# Patient Record
Sex: Female | Born: 1985 | Race: White | Hispanic: No | Marital: Married | Smoking: Former smoker
Health system: Southern US, Community
[De-identification: ages and names within clinical notes are randomized; demographics above are authoritative.]

## PROBLEM LIST (undated history)

## (undated) DIAGNOSIS — F909 Attention-deficit hyperactivity disorder, unspecified type: Secondary | ICD-10-CM

## (undated) HISTORY — PX: ABDOMINAL SURGERY: SHX537

## (undated) HISTORY — DX: Attention-deficit hyperactivity disorder, unspecified type: F90.9

---
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
## 2012-07-27 ENCOUNTER — Inpatient Hospital Stay: Payer: Self-pay | Admitting: Obstetrics and Gynecology

## 2013-04-16 IMAGING — US US OB US >=[ID] SNGL FETUS
1 series · 14 of 28 positions shown · non-contrast
Comparison: none

REASON FOR EXAM: PPROM
COMMENTS:   May transport without cardiac monitor

PROCEDURE:     US  - US OB GREATER/OR EQUAL TO 0ER3N  - July 27, 2012 [DATE]
RESULT:

[Series 1: us ob us >=(id) sngl fetus · 0.26mm/px · 14 of 59 slices shown]
[im 3/59]
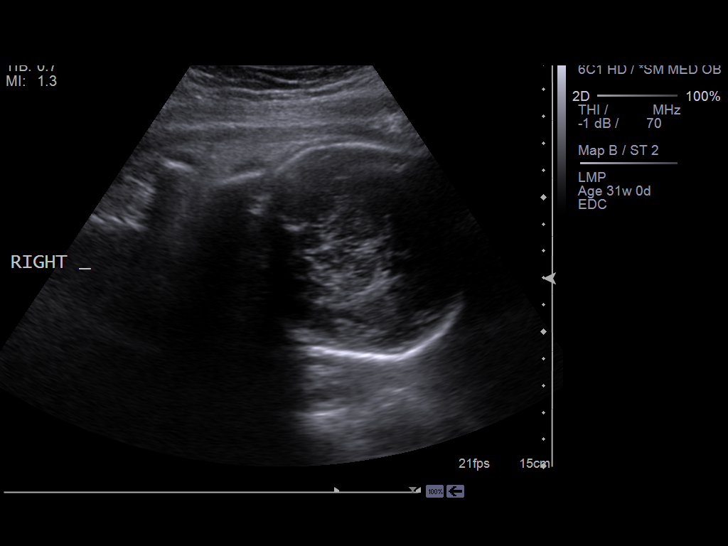
[im 7/59]
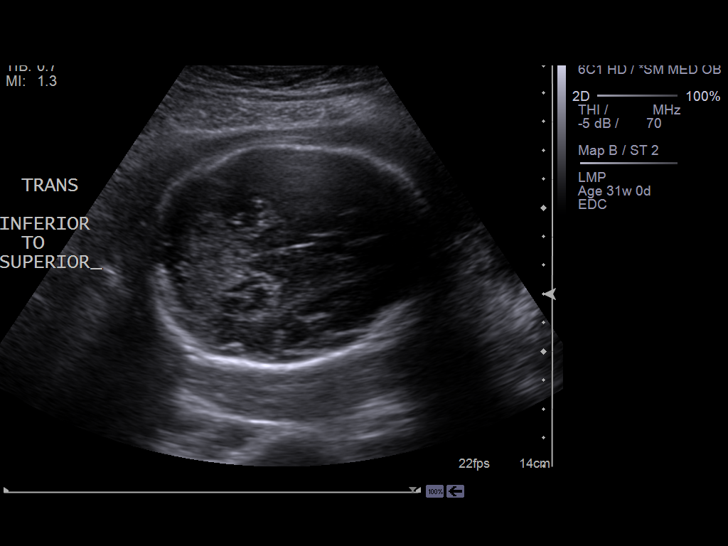
[im 11/59]
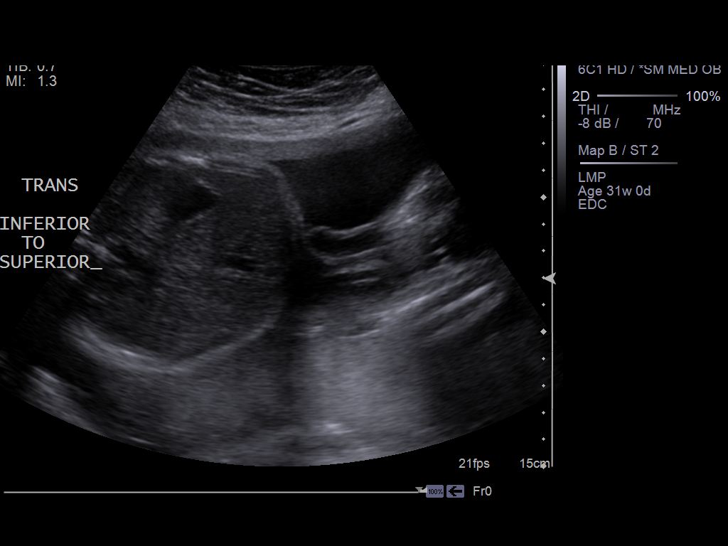
[im 16/59]
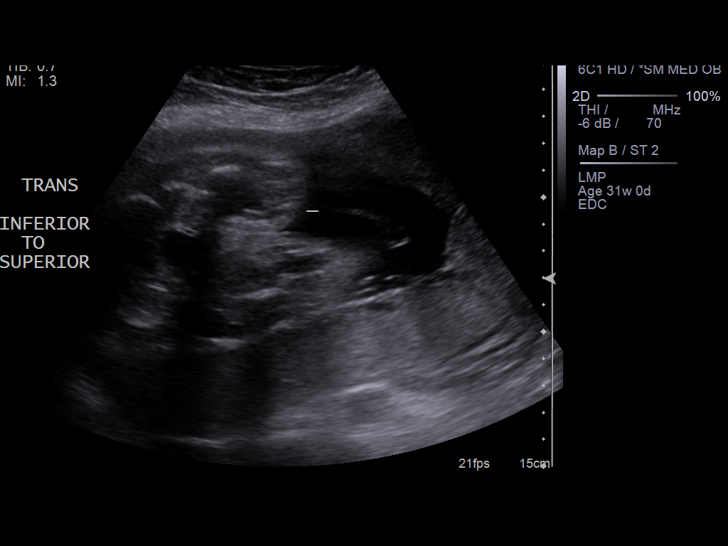
[im 20/59]
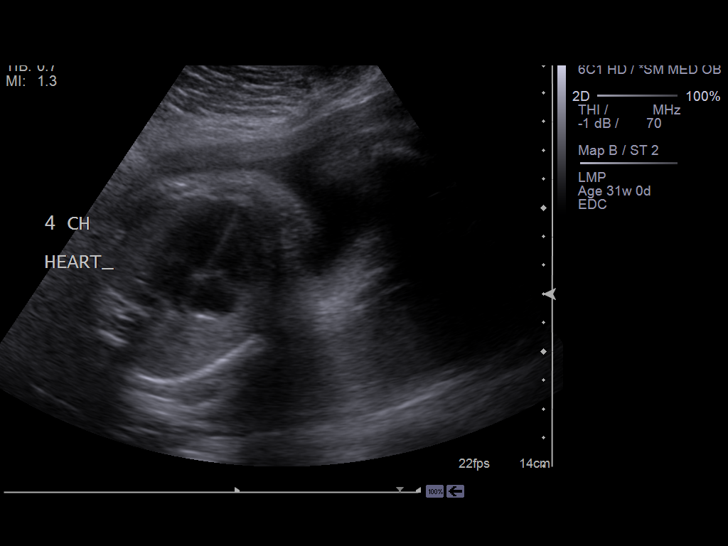
[im 24/59]
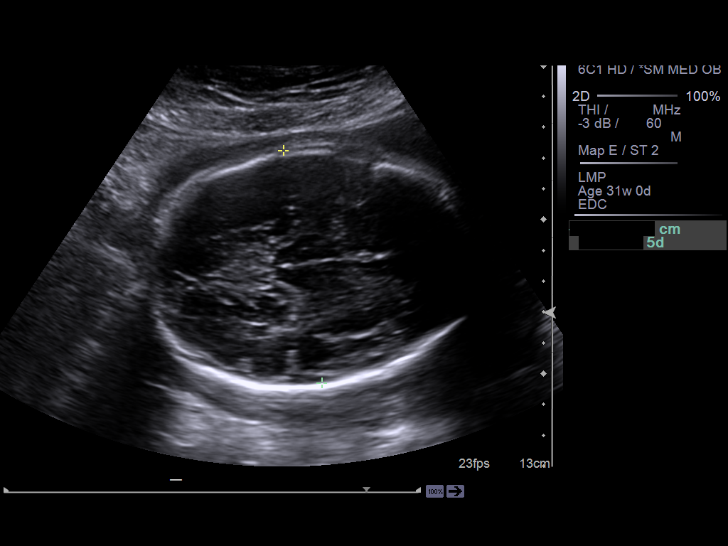
[im 28/59]
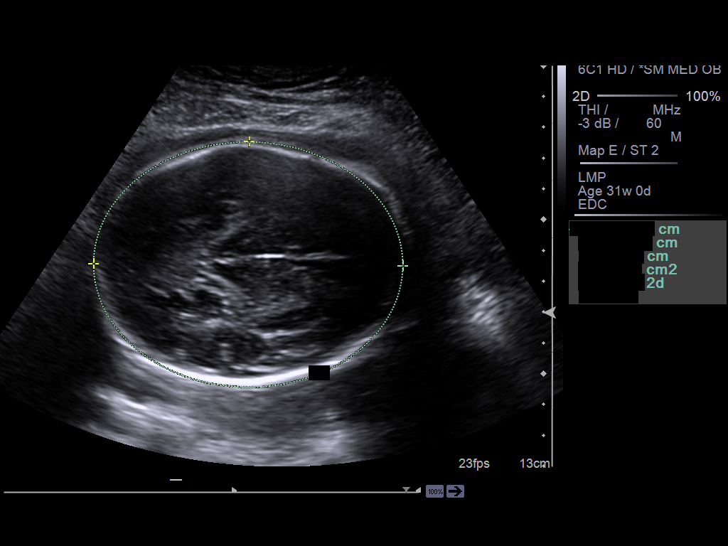
[im 33/59]
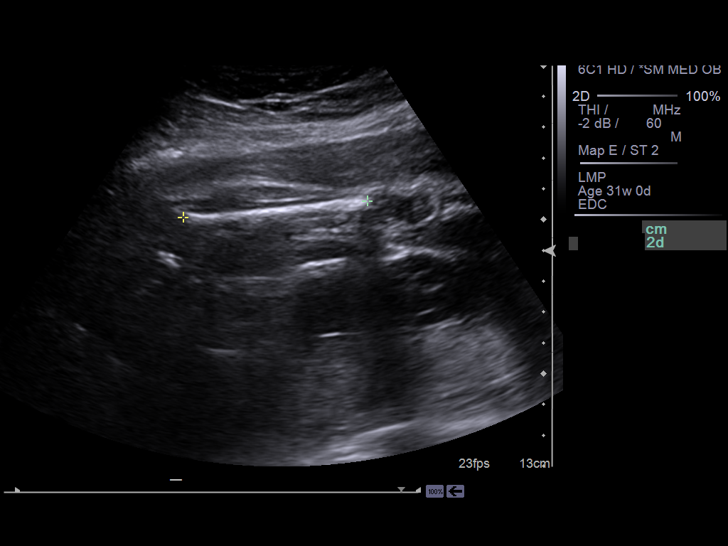
[im 37/59]
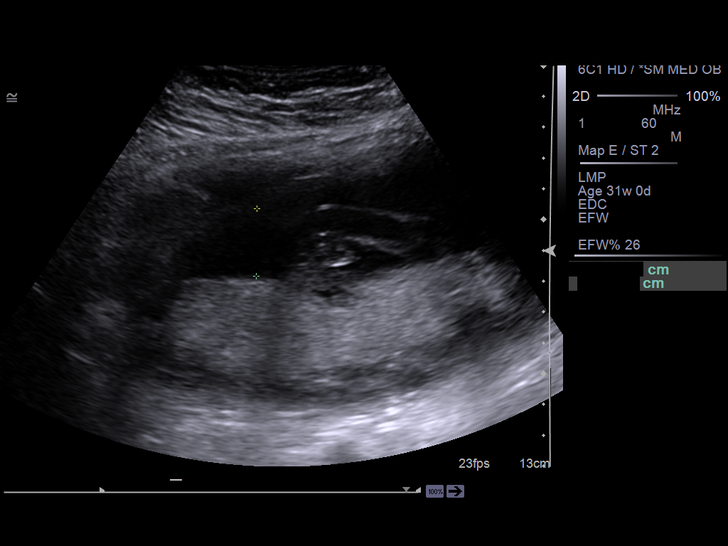
[im 41/59]
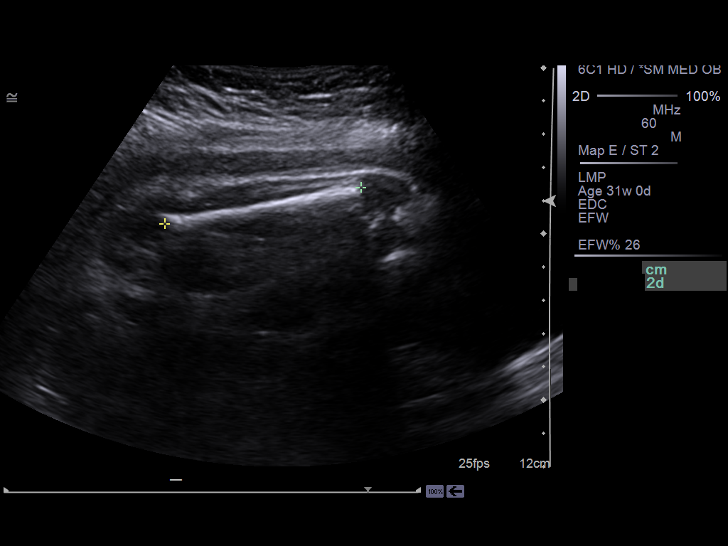
[im 46/59]
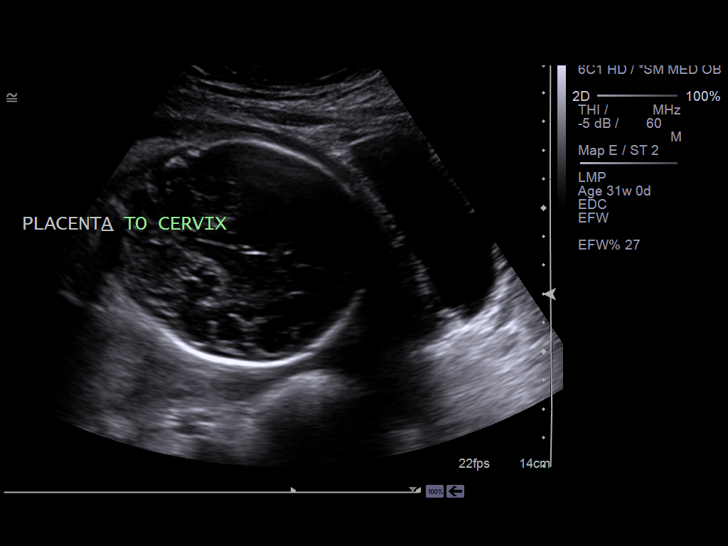
[im 50/59]
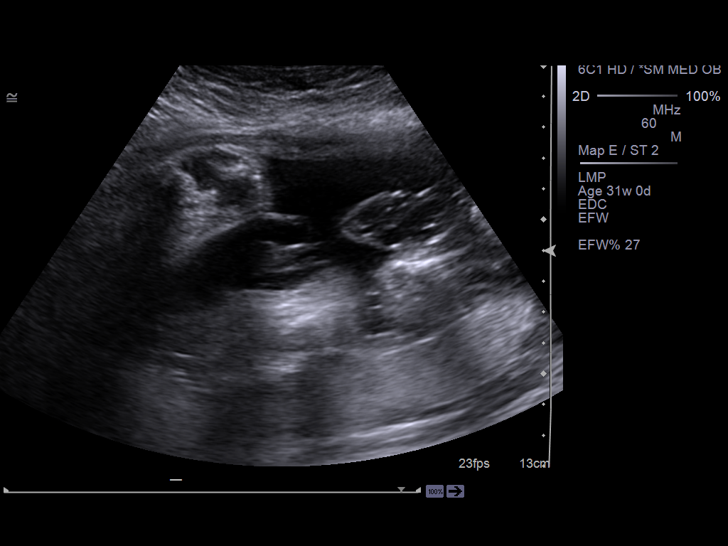
[im 54/59]
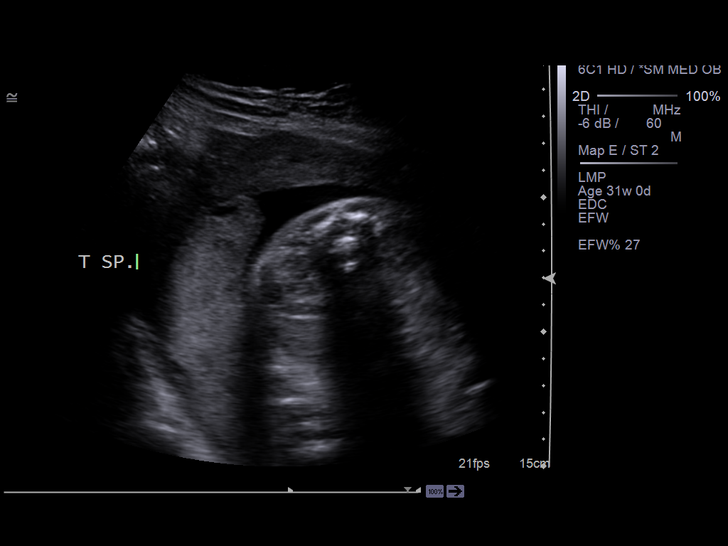
[im 59/59]
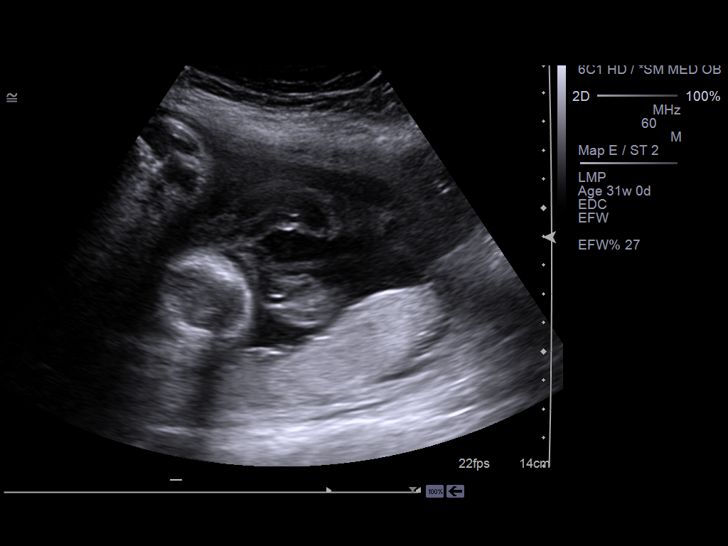

[14 of 28 positions shown; findings below may reference images not displayed]

FINDINGS: Single, viable intrauterine pregnancy is identified in vertex
presentation. Estimated fetal heart rate is 142 beats per minute. The
placenta is posterior and demonstrates Grade II morphology. There is no
evidence of placenta previa or subchorionic hemorrhage. The amniotic fluid
index is 10.4 cm measured in a four quadrant fashion. Quadrant 1 is 2.1cm;
quadrant 2 is 2.68; quadrant 3 is 2.47 and quadrant 4 is 3.09 cm. The
amniotic fluid index is within normal limits. Fetal biometry calculates
estimated gestational age of 30 weeks, 6 days with an EDD of 09/28/2012
congruent with the patient's LMP. Estimated fetal weight is 1,613 grams + / -
239 grams which correlates to 3 pounds, 9 ounces + / - 8 ounces.
IMPRESSION: 1.  Single, viable intrauterine pregnancy as described above. Estimated
fetal weight is 1,613 grams as described above and the amniotic fluid index
is 10.4 cm.
2.  A preliminary faxed report was relayed to the floor on 07/27/2012 at

## 2013-10-10 ENCOUNTER — Ambulatory Visit: Payer: Self-pay | Admitting: Podiatry

## 2014-03-29 ENCOUNTER — Ambulatory Visit (INDEPENDENT_AMBULATORY_CARE_PROVIDER_SITE_OTHER): Payer: BC Managed Care – PPO | Admitting: Family Medicine

## 2014-03-29 ENCOUNTER — Encounter: Payer: Self-pay | Admitting: Family Medicine

## 2014-03-29 VITALS — BP 111/76 | HR 78 | Temp 98.7°F | Ht 64.0 in | Wt 175.0 lb

## 2014-03-29 DIAGNOSIS — F988 Other specified behavioral and emotional disorders with onset usually occurring in childhood and adolescence: Secondary | ICD-10-CM

## 2014-03-29 MED ORDER — AMPHETAMINE-DEXTROAMPHETAMINE 20 MG PO TABS: 20.0000 mg | ORAL_TABLET | Freq: Every day | ORAL | Status: DC

## 2014-03-29 NOTE — Progress Notes (Signed)
   Subjective:    Patient ID: Katelyn Waller, female    DOB: 01/18/1986, 28 y.o.   MRN: [id number]  HPI This 28 y.o. female presents for evaluation of ADD.  She is having difficulty with focus and wants to get back on her ADD medicine.   Review of Systems No chest pain, SOB, HA, dizziness, vision change, N/V, diarrhea, constipation, dysuria, urinary urgency or frequency, myalgias, arthralgias or rash.     Objective:   Physical Exam  Vital signs noted  Well developed well nourished female.  HEENT - Head atraumatic Normocephalic                Eyes - PERRLA, Conjuctiva - clear Sclera- Clear EOMI                Ears - EAC's Wnl TM's Wnl Gross Hearing WNL                 Throat - oropharanx wnl Respiratory - Lungs CTA bilateral Cardiac - RRR S1 and S2 without murmur GI - Abdomen soft Nontender and bowel sounds active x 4       Assessment & Plan:  ADD (attention deficit disorder) - Plan: amphetamine-dextroamphetamine (ADDERALL) 20 MG tablet  Follow up in 3 months  Deatra CanterWilliam J Nilson Tabora FNP

## 2014-05-01 ENCOUNTER — Other Ambulatory Visit: Payer: Self-pay | Admitting: Family Medicine

## 2014-05-01 ENCOUNTER — Telehealth: Payer: Self-pay | Admitting: *Deleted

## 2014-05-01 DIAGNOSIS — F988 Other specified behavioral and emotional disorders with onset usually occurring in childhood and adolescence: Secondary | ICD-10-CM

## 2014-05-01 MED ORDER — AMPHETAMINE-DEXTROAMPHETAMINE 20 MG PO TABS: 20.0000 mg | ORAL_TABLET | Freq: Every day | ORAL | Status: DC

## 2014-05-01 NOTE — Telephone Encounter (Signed)
Requesting refill on Adderall

## 2014-06-13 ENCOUNTER — Other Ambulatory Visit: Payer: Self-pay | Admitting: Family Medicine

## 2014-06-13 MED ORDER — AMPHETAMINE-DEXTROAMPHETAMINE 20 MG PO TABS: 20.0000 mg | ORAL_TABLET | Freq: Every day | ORAL | Status: DC

## 2014-07-12 ENCOUNTER — Telehealth: Payer: Self-pay | Admitting: *Deleted

## 2014-07-12 ENCOUNTER — Other Ambulatory Visit: Payer: Self-pay | Admitting: Family Medicine

## 2014-07-12 MED ORDER — AMPHETAMINE-DEXTROAMPHETAMINE 20 MG PO TABS: 20.0000 mg | ORAL_TABLET | Freq: Every day | ORAL | Status: DC

## 2014-07-12 NOTE — Telephone Encounter (Signed)
Pt needs refill on Adderall Please advise

## 2014-09-05 ENCOUNTER — Other Ambulatory Visit: Payer: Self-pay | Admitting: Family Medicine

## 2014-09-05 DIAGNOSIS — F988 Other specified behavioral and emotional disorders with onset usually occurring in childhood and adolescence: Secondary | ICD-10-CM

## 2014-09-05 MED ORDER — AMPHETAMINE-DEXTROAMPHETAMINE 20 MG PO TABS: 20.0000 mg | ORAL_TABLET | Freq: Every day | ORAL | Status: DC

## 2014-12-17 NOTE — Consult Note (Signed)
    Maternal Age 31    Gravida 2    Para 1    Term Deliveries 1    Preterm Deliveries 0    Abortions 0    Living Children 1    Final EDD (dd-mmm-yy) 27-Sep-2012    Gestational Age (wk, days based on mom's dates) 1731    Gestation Single    Blood Type (Maternal) O positive    Antibody Screen Results (Maternal) negative    HIV Results (Maternal) negative    Gonorrhea Results (Maternal) negative    Chlamydia Results (Maternal) negative    Hepatitis C Culture (Maternal) unknown    Herpes Results (Maternal) n/a    VDRL/RPR/Syphilis Results (Maternal) negative    Varicella Titer Results (Maternal) Positive    Rubella Results (Maternal) immune    Hepatitis B Surface Antigen Results (Maternal) negative    Group B Strep Results Maternal (Result >5wks must be treated as unknown) unknown/result > 5 weeks ago     Additional Comments Called to consult on this [redacted]wk gestation age infant born to a G2P1 mother with 2cm dilation of cervix and ROM this evening. I discussed with mother, father and grandmother the typical complications of birth at 830weeks gestation age. We discussed morbidity and mortality, respiratory support, feeding, IV access peripheral and central, length of time in NICU, thermal stability, and basic premature infant care. We discussed typical follow up NICU care regarding ROP and HUS. We also discussed the infant???s inability to PO bottle feed until 34 weeks, and again the average length of time within an SCN setting.  Currently, our SCN is full and I discussed with family that if mother does start to move towards active labor that we could have her moved to a tertiary care center so that she could be with her infant. If her infant delivery precipitously here, we will have no problems treating and stabilizing the infant, but I would have to transport the infant to [facility] N[facility] due to no available bed in our SCN.  Mother and father understand the above. During the evening  mother did start to progress into active labor. Discussed with OBGYN, and recommended [facility] so that she and the infant can be together.  Total consult time 45 minutes.    Parental Contact Parents informed at length regarding prenatal care and plan   Electronic Signatures: Corliss ParishGaliote, Foye Haggart P (MD)  (Signed [phone]-Nov-13 05:24)  Authored: PREGNANCY and LABOR, ADDITIONAL COMMENTS   Last Updated: 28-Nov-13 05:24 by Corliss ParishGaliote, Kelia Gibbon P (MD)

## 2014-12-19 ENCOUNTER — Other Ambulatory Visit: Payer: Self-pay | Admitting: *Deleted

## 2014-12-19 MED ORDER — SERTRALINE HCL 50 MG PO TABS: 50.0000 mg | ORAL_TABLET | Freq: Every day | ORAL | Status: DC

## 2014-12-19 NOTE — Telephone Encounter (Signed)
Patient requesting refill on Zoloft 50 mg. She discontinued this during pregnancy and didn't restart it.  She will schedule an appointment to be seen within the next month but would like to go ahead and restart this for her anxiety/depression symptoms.

## 2015-01-07 NOTE — H&P (Signed)
L&D Evaluation:  History Expanded:   HPI 29 yo who was in the shower last night and got out and felt a gush of fluid when she got out, then when she dried off she felt another trickle going down her leg, she then got a towel and it did not help so she came in. she is 31 weeks today.    Gravida 2    Term 0    PreTerm 0    Abortion 1    Living 0    Blood Type (Maternal) O positive    Group B Strep Results Maternal (Result >5wks must be treated as unknown) unknown/result > 5 weeks ago    Maternal HIV Negative    Maternal Syphilis Ab Nonreactive    Maternal Varicella Immune    Rubella Results (Maternal) immune    Maternal [facility]-Dap Immune    [facility]    Presents with rom    Patient's Medical History No Chronic Illness    Patient's Surgical History none    Medications Pre Natal Vitamins    Allergies NKDA    Social History none    Family History Non-Contributory   ROS:   ROS All systems were reviewed.  HEENT, CNS, GI, GU, Respiratory, CV, Renal and Musculoskeletal systems were found to be normal.   Exam:   Vital Signs stable    Urine Protein not completed    General no apparent distress    Mental Status clear    Chest clear    Heart normal sinus rhythm    Abdomen gravid, non-tender    Fetal Position v    Back no CVAT    Edema no edema    Reflexes 1+    Pelvic no external lesions    Mebranes Ruptured    Description clear, fern pos, nitrazine pos, pool pos,    FHT normal rate with no decels, strictly reactive    Fetal Heart Rate 145    Ucx absent, pt feels crampoy,    Skin dry    Lymph no lymphadenopathy   Impression:   Impression PPROM   Plan:   Comments start PPROM protocol and call [facility]   Electronic Signatures: Adria DevonKlett, Kaikoa Magro (MD)  (Signed [phone]-Nov-13 02:19)  Authored: L&D Evaluation   Last Updated: 28-Nov-13 02:19 by Adria DevonKlett, Lorelie Biermann (MD)

## 2015-02-12 ENCOUNTER — Encounter: Payer: Self-pay | Admitting: Family

## 2015-02-12 ENCOUNTER — Ambulatory Visit (INDEPENDENT_AMBULATORY_CARE_PROVIDER_SITE_OTHER): Payer: No Typology Code available for payment source | Admitting: Family

## 2015-02-12 VITALS — BP 113/78 | HR 78 | Temp 97.0°F | Ht 64.0 in | Wt 182.0 lb

## 2015-02-12 DIAGNOSIS — F909 Attention-deficit hyperactivity disorder, unspecified type: Secondary | ICD-10-CM

## 2015-02-12 MED ORDER — LISDEXAMFETAMINE DIMESYLATE 30 MG PO CAPS: 30.0000 mg | ORAL_CAPSULE | Freq: Every day | ORAL | Status: DC

## 2015-02-12 NOTE — Progress Notes (Signed)
   Subjective:    Patient ID: Katelyn Waller, female    DOB: 03-10-1986, 29 y.o.   MRN: [id number]    HPI Pt presents to the office to establish care. Pt has a history of ADHD. Pt has taken vyvanse in the past which she states worked well. Pt was last on Adderall  And states she hated it made her feel "werid" and does not want to be back on it. Pt states  She also has a history of GAD and depression and was Zoloft for it. Pt states the medication made her feel so "sleepy" and she just stopped taking it. Pt currently not taking any medication at this time. Pt denies any headache, palpitations, SOB, or edema at this time.    Review of Systems  Constitutional: Negative.   HENT: Negative.   Eyes: Negative.   Respiratory: Negative.  Negative for shortness of breath.   Cardiovascular: Negative.  Negative for palpitations.  Gastrointestinal: Negative.   Endocrine: Negative.   Genitourinary: Negative.   Musculoskeletal: Negative.   Neurological: Negative.  Negative for headaches.  Hematological: Negative.   Psychiatric/Behavioral: Negative.   All other systems reviewed and are negative.      Objective:   Physical Exam  Constitutional: She is oriented to person, place, and time. She appears well-developed and well-nourished. No distress.  HENT:  Head: Normocephalic and atraumatic.  Right Ear: External ear normal.  Left Ear: External ear normal.  Nose: Nose normal.  Mouth/Throat: Oropharynx is clear and moist.  Eyes: Pupils are equal, round, and reactive to light.  Neck: Normal range of motion. Neck supple. No thyromegaly present.  Cardiovascular: Normal rate, regular rhythm, normal heart sounds and intact distal pulses.   No murmur heard. Pulmonary/Chest: Effort normal and breath sounds normal. No respiratory distress. She has no wheezes.  Abdominal: Soft. Bowel sounds are normal. She exhibits no distension. There is no tenderness.  Musculoskeletal: Normal range of motion. She  exhibits no edema or tenderness.  Neurological: She is alert and oriented to person, place, and time. She has normal reflexes. No cranial nerve deficit.  Skin: Skin is warm and dry.  Psychiatric: She has a normal mood and affect. Her behavior is normal. Judgment and thought content normal.  Vitals reviewed.   BP 113/78 mmHg  Pulse 78  Temp(Src) 97 F (36.1 C) (Oral)  Ht 5\' 4"  (1.626 m)  Wt 182 lb (82.555 kg)  BMI 31.22 kg/m2       Assessment & Plan:  1. Attention deficit hyperactivity disorder (ADHD), unspecified ADHD type -Meds as prescribed Behavior modification as needed Follow-up for recheck in 2 weeks - lisdexamfetamine (VYVANSE) 30 MG capsule; Take 1 capsule (30 mg total) by mouth daily.  Dispense: 30 capsule; Refill: 0   Continue all meds Health Maintenance reviewed Diet and exercise encouraged RTO 2 weeks  Jannifer Rodney, FNP

## 2015-02-12 NOTE — Patient Instructions (Signed)

## 2015-02-13 ENCOUNTER — Telehealth: Payer: Self-pay

## 2015-02-13 NOTE — Telephone Encounter (Signed)
Insurance prior authorized Vyvanse 30 mg for 1 year

## 2015-03-05 ENCOUNTER — Ambulatory Visit: Payer: No Typology Code available for payment source | Admitting: Family

## 2015-03-10 ENCOUNTER — Encounter: Payer: Self-pay | Admitting: Family

## 2015-03-10 ENCOUNTER — Ambulatory Visit (INDEPENDENT_AMBULATORY_CARE_PROVIDER_SITE_OTHER): Payer: No Typology Code available for payment source | Admitting: Family

## 2015-03-10 VITALS — BP 125/82 | HR 82 | Temp 98.1°F | Ht 64.0 in | Wt 180.8 lb

## 2015-03-10 DIAGNOSIS — F909 Attention-deficit hyperactivity disorder, unspecified type: Secondary | ICD-10-CM | POA: Diagnosis not present

## 2015-03-10 MED ORDER — LISDEXAMFETAMINE DIMESYLATE 50 MG PO CAPS: 50.0000 mg | ORAL_CAPSULE | Freq: Every day | ORAL | Status: DC

## 2015-03-10 NOTE — Patient Instructions (Signed)

## 2015-03-10 NOTE — Progress Notes (Signed)
   Subjective:    Patient ID: Katelyn BaileyJami M Murin, female    DOB: 05/02/1986, 29 y.o.   MRN: [id number]  HPI Pt presents to the office to recheck ADHD. PT currently taking Vyvanse 30 mg daily. Pt states she feels like it works well, but feels like the dose needs to be increased because it seems "wear off". Pt denies any adverse effects or concerns at this time. PT states she feels like her concentration  has improved.     Review of Systems  Constitutional: Negative.   HENT: Negative.   Eyes: Negative.   Respiratory: Negative.  Negative for shortness of breath.   Cardiovascular: Negative.  Negative for palpitations.  Gastrointestinal: Negative.   Endocrine: Negative.   Genitourinary: Negative.   Musculoskeletal: Negative.   Neurological: Negative.  Negative for headaches.  Hematological: Negative.   Psychiatric/Behavioral: Negative.   All other systems reviewed and are negative.      Objective:   Physical Exam  Constitutional: She is oriented to person, place, and time. She appears well-developed and well-nourished. No distress.  HENT:  Head: Normocephalic and atraumatic.  Right Ear: External ear normal.  Left Ear: External ear normal.  Nose: Nose normal.  Mouth/Throat: Oropharynx is clear and moist.  Eyes: Pupils are equal, round, and reactive to light.  Neck: Normal range of motion. Neck supple. No thyromegaly present.  Cardiovascular: Normal rate, regular rhythm, normal heart sounds and intact distal pulses.   No murmur heard. Pulmonary/Chest: Effort normal and breath sounds normal. No respiratory distress. She has no wheezes.  Abdominal: Soft. Bowel sounds are normal. She exhibits no distension. There is no tenderness.  Musculoskeletal: Normal range of motion. She exhibits no edema or tenderness.  Neurological: She is alert and oriented to person, place, and time. She has normal reflexes. No cranial nerve deficit.  Skin: Skin is warm and dry.  Psychiatric: She has a normal  mood and affect. Her behavior is normal. Judgment and thought content normal.  Vitals reviewed.   BP 125/82 mmHg  Pulse 82  Temp(Src) 98.1 F (36.7 C) (Oral)  Ht 5\' 4"  (1.626 m)  Wt 180 lb 12.8 oz (82.01 kg)  BMI 31.02 kg/m2       Assessment & Plan:  1. Attention deficit hyperactivity disorder (ADHD), unspecified ADHD type -Meds as prescribed Behavior modification as needed Follow-up for recheck in 3 months - lisdexamfetamine (VYVANSE) 50 MG capsule; Take 1 capsule (50 mg total) by mouth daily.  Dispense: 30 capsule; Refill: 0 - lisdexamfetamine (VYVANSE) 50 MG capsule; Take 1 capsule (50 mg total) by mouth daily.  Dispense: 30 capsule; Refill: 0  Jannifer Rodneyhristy Birttany Dechellis, FNP

## 2015-10-30 ENCOUNTER — Telehealth: Payer: Self-pay | Admitting: Family

## 2017-05-23 ENCOUNTER — Encounter: Payer: Self-pay | Admitting: Family

## 2017-05-23 ENCOUNTER — Ambulatory Visit (INDEPENDENT_AMBULATORY_CARE_PROVIDER_SITE_OTHER): Payer: BLUE CROSS/BLUE SHIELD | Admitting: Family

## 2017-05-23 VITALS — BP 114/81 | HR 61 | Temp 98.4°F | Ht 64.0 in | Wt 192.6 lb

## 2017-05-23 DIAGNOSIS — F900 Attention-deficit hyperactivity disorder, predominantly inattentive type: Secondary | ICD-10-CM | POA: Diagnosis not present

## 2017-05-23 MED ORDER — LISDEXAMFETAMINE DIMESYLATE 50 MG PO CAPS: 50.0000 mg | ORAL_CAPSULE | Freq: Every day | ORAL | 0 refills | Status: DC

## 2017-05-23 MED ORDER — LISDEXAMFETAMINE DIMESYLATE 50 MG PO CAPS
50.0000 mg | ORAL_CAPSULE | Freq: Every day | ORAL | 0 refills | Status: DC
Start: 2017-05-23 — End: 2017-08-26

## 2017-05-23 NOTE — Progress Notes (Signed)
   Subjective:    Patient ID: Katelyn Waller, female    DOB: 10/06/1985, 31 y.o.   MRN: [id number]  HPI PT presents to the office today for ADHD refill. PT has taken Vyvanase 50 mg daily that helps her stay focus at work. She states she has been out of this several months and can tell a big difference with her concentration. PT denies any hyperactivity.     Review of Systems  Psychiatric/Behavioral: Positive for decreased concentration.  All other systems reviewed and are negative.      Objective:   Physical Exam  Constitutional: She is oriented to person, place, and time. She appears well-developed and well-nourished. No distress.  HENT:  Head: Normocephalic.  Eyes: Pupils are equal, round, and reactive to light.  Neck: Normal range of motion. Neck supple. No thyromegaly present.  Cardiovascular: Normal rate, regular rhythm, normal heart sounds and intact distal pulses.   No murmur heard. Pulmonary/Chest: Effort normal and breath sounds normal. No respiratory distress. She has no wheezes.  Abdominal: Soft. Bowel sounds are normal. She exhibits no distension. There is no tenderness.  Musculoskeletal: Normal range of motion. She exhibits no edema or tenderness.  Neurological: She is alert and oriented to person, place, and time.  Skin: Skin is warm and dry.  Psychiatric: She has a normal mood and affect. Her behavior is normal. Judgment and thought content normal.  Vitals reviewed.     BP 114/81   Pulse 61   Temp 98.4 F (36.9 C) (Oral)   Ht  (1.626 m)   Wt 192 lb 9.6 oz (87.4 kg)   BMI 33.06 kg/m      Assessment & Plan:  1. Attention deficit hyperactivity disorder (ADHD), predominantly inattentive type Meds as prescribed Behavior modification as needed Follow-up for recheck in 3 months - lisdexamfetamine (VYVANSE) 50 MG capsule; Take 1 capsule (50 mg total) by mouth daily.  Dispense: 30 capsule; Refill: 0 - lisdexamfetamine (VYVANSE) 50 MG capsule; Take 1  capsule (50 mg total) by mouth daily.  Dispense: 30 capsule; Refill: 0 - lisdexamfetamine (VYVANSE) 50 MG capsule; Take 1 capsule (50 mg total) by mouth daily.  Dispense: 30 capsule; Refill: 0   Jannifer Rodney, FNP

## 2017-05-23 NOTE — Patient Instructions (Signed)

## 2017-08-26 ENCOUNTER — Encounter: Payer: Self-pay | Admitting: Family

## 2017-08-26 ENCOUNTER — Ambulatory Visit: Payer: BLUE CROSS/BLUE SHIELD | Admitting: Family

## 2017-08-26 VITALS — BP 121/83 | HR 71 | Temp 96.9°F | Ht 64.0 in | Wt 185.0 lb

## 2017-08-26 DIAGNOSIS — F909 Attention-deficit hyperactivity disorder, unspecified type: Secondary | ICD-10-CM

## 2017-08-26 MED ORDER — LISDEXAMFETAMINE DIMESYLATE 60 MG PO CAPS: 60.0000 mg | ORAL_CAPSULE | ORAL | 0 refills | Status: DC

## 2017-08-26 NOTE — Progress Notes (Signed)
   Subjective:    Patient ID: Earnest BaileyJami M Perrow, female    DOB: 12/23/1985, 31 y.o.   MRN: [id number]  HPI PT presents to the office today ADHD refill. PT currently taking Vyvanse 50 mg. Pt states this is working well, except she feels like it "wears off" by 2-3 pm and she is still at work.   Denies any adverse effects. Weight stable.    Review of Systems  All other systems reviewed and are negative.      Objective:   Physical Exam  Constitutional: She is oriented to person, place, and time. She appears well-developed and well-nourished. No distress.  HENT:  Head: Normocephalic and atraumatic.  Right Ear: External ear normal.  Left Ear: External ear normal.  Nose: Nose normal.  Mouth/Throat: Oropharynx is clear and moist.  Eyes: Pupils are equal, round, and reactive to light.  Neck: Normal range of motion. Neck supple. No thyromegaly present.  Cardiovascular: Normal rate, regular rhythm, normal heart sounds and intact distal pulses.  No murmur heard. Pulmonary/Chest: Effort normal and breath sounds normal. No respiratory distress. She has no wheezes.  Abdominal: Soft. Bowel sounds are normal. She exhibits no distension. There is no tenderness.  Musculoskeletal: Normal range of motion. She exhibits no edema or tenderness.  Neurological: She is alert and oriented to person, place, and time.  Skin: Skin is warm and dry.  Psychiatric: She has a normal mood and affect. Her behavior is normal. Judgment and thought content normal.  Vitals reviewed.     BP 121/83   Pulse 71   Temp (!) 96.9 F (36.1 C) (Oral)   Ht 5\' 4"  (1.626 m)   Wt 185 lb (83.9 kg)   BMI 31.76 kg/m      Assessment & Plan:  1. Attention deficit hyperactivity disorder (ADHD), unspecified ADHD type Will increase Vyvanse to 60 mg from 50 mg Meds as prescribed Behavior modification as needed Follow-up for recheck in 3 months - lisdexamfetamine (VYVANSE) 60 MG capsule; Take 1 capsule (60 mg total) by mouth  every morning.  Dispense: 30 capsule; Refill: 0 - lisdexamfetamine (VYVANSE) 60 MG capsule; Take 1 capsule (60 mg total) by mouth every morning.  Dispense: 30 capsule; Refill: 0 - lisdexamfetamine (VYVANSE) 60 MG capsule; Take 1 capsule (60 mg total) by mouth every morning.  Dispense: 30 capsule; Refill: 0  Jannifer Rodneyhristy Hawks, FNP

## 2017-08-26 NOTE — Patient Instructions (Signed)

## 2017-12-21 ENCOUNTER — Encounter: Payer: Self-pay | Admitting: Family

## 2017-12-21 ENCOUNTER — Ambulatory Visit (INDEPENDENT_AMBULATORY_CARE_PROVIDER_SITE_OTHER): Payer: BLUE CROSS/BLUE SHIELD | Admitting: Family

## 2017-12-21 VITALS — BP 112/83 | HR 80 | Temp 96.9°F | Wt 182.0 lb

## 2017-12-21 DIAGNOSIS — F909 Attention-deficit hyperactivity disorder, unspecified type: Secondary | ICD-10-CM

## 2017-12-21 MED ORDER — LISDEXAMFETAMINE DIMESYLATE 50 MG PO CAPS: 50.0000 mg | ORAL_CAPSULE | Freq: Every day | ORAL | 0 refills | Status: DC

## 2017-12-21 NOTE — Progress Notes (Signed)
   Subjective:    Patient ID: Katelyn Waller, female    DOB: 07/25/1986, 32 y.o.   MRN: [id number]  HPI PT presents to the office today today for ADHD follow up. States she feels like the Vyvanse is working well, but feels like she could decrease her dose. States this has helped her stay focused at work and complete her tasks. Denies any changes in weight or adverse effects.    Review of Systems  All other systems reviewed and are negative.      Objective:   Physical Exam  Constitutional: She is oriented to person, place, and time. She appears well-developed and well-nourished. No distress.  HENT:  Head: Normocephalic and atraumatic.  Right Ear: External ear normal.  Left Ear: External ear normal.  Nose: Nose normal.  Mouth/Throat: Oropharynx is clear and moist.  Eyes: Pupils are equal, round, and reactive to light.  Neck: Normal range of motion. Neck supple. No thyromegaly present.  Cardiovascular: Normal rate, regular rhythm, normal heart sounds and intact distal pulses.  No murmur heard. Pulmonary/Chest: Effort normal and breath sounds normal. No respiratory distress. She has no wheezes.  Abdominal: Soft. Bowel sounds are normal. She exhibits no distension. There is no tenderness.  Musculoskeletal: Normal range of motion. She exhibits no edema or tenderness.  Neurological: She is alert and oriented to person, place, and time.  Skin: Skin is warm and dry.  Psychiatric: She has a normal mood and affect. Her behavior is normal. Judgment and thought content normal.  Vitals reviewed.   BP 112/83   Pulse 80   Temp (!) 96.9 F (36.1 C) (Oral)   Wt 182 lb (82.6 kg)   BMI 31.24 kg/m      Assessment & Plan:  1. Attention deficit hyperactivity disorder (ADHD), unspecified ADHD type Will decrease Vyvanse to 50 mg from 60 mg Meds as prescribed Behavior modification as needed Follow-up for recheck in 3 months - lisdexamfetamine (VYVANSE) 50 MG capsule; Take 1 capsule (50 mg  total) by mouth daily.  Dispense: 30 capsule; Refill: 0 - lisdexamfetamine (VYVANSE) 50 MG capsule; Take 1 capsule (50 mg total) by mouth daily.  Dispense: 30 capsule; Refill: 0 - lisdexamfetamine (VYVANSE) 50 MG capsule; Take 1 capsule (50 mg total) by mouth daily.  Dispense: 30 capsule; Refill: 0    Jannifer Rodneyhristy Christofer Shen, FNP

## 2017-12-21 NOTE — Patient Instructions (Signed)

## 2018-05-26 ENCOUNTER — Encounter: Payer: Self-pay | Admitting: Family

## 2018-05-26 ENCOUNTER — Ambulatory Visit (INDEPENDENT_AMBULATORY_CARE_PROVIDER_SITE_OTHER): Payer: BLUE CROSS/BLUE SHIELD | Admitting: Family

## 2018-05-26 VITALS — BP 119/85 | HR 68 | Temp 96.8°F | Ht 64.0 in | Wt 183.2 lb

## 2018-05-26 DIAGNOSIS — Z0000 Encounter for general adult medical examination without abnormal findings: Secondary | ICD-10-CM

## 2018-05-26 DIAGNOSIS — F909 Attention-deficit hyperactivity disorder, unspecified type: Secondary | ICD-10-CM

## 2018-05-26 DIAGNOSIS — R5383 Other fatigue: Secondary | ICD-10-CM

## 2018-05-26 DIAGNOSIS — Z79899 Other long term (current) drug therapy: Secondary | ICD-10-CM

## 2018-05-26 MED ORDER — LISDEXAMFETAMINE DIMESYLATE 40 MG PO CAPS: 40.0000 mg | ORAL_CAPSULE | ORAL | 0 refills | Status: DC

## 2018-05-26 NOTE — Progress Notes (Signed)
Subjective:    Patient ID: Katelyn Waller, female    DOB: 09-28-1985, 32 y.o.   MRN: [id number]  Chief Complaint  Patient presents with  . Annual Exam  . ADHD    refills   HPI Pt presents to the office today for CPE without pap and ADHD refills. She is currently taking Vyvanse 50 mg daily that is working well. States this helps her stay focused at work and stay on task. She states she would like to decrease to 40 mg to see how this will work for her.   Pt denies any headache, SOB, or edema at this time.   She states she had her pap at [facility] about 2-3 years ago. She will follow up with them.   She does complain of some slight fatigue that is has been occurring for several months.   Review of Systems  All other systems reviewed and are negative.      Objective:   Physical Exam  Constitutional: She is oriented to person, place, and time. She appears well-developed and well-nourished. No distress.  HENT:  Head: Normocephalic and atraumatic.  Right Ear: External ear normal.  Left Ear: External ear normal.  Mouth/Throat: Oropharynx is clear and moist.  Eyes: Pupils are equal, round, and reactive to light.  Neck: Normal range of motion. Neck supple. No thyromegaly present.  Cardiovascular: Normal rate, regular rhythm, normal heart sounds and intact distal pulses.  No murmur heard. Pulmonary/Chest: Effort normal and breath sounds normal. No respiratory distress. She has no wheezes.  Abdominal: Soft. Bowel sounds are normal. She exhibits no distension. There is no tenderness.  Musculoskeletal: Normal range of motion. She exhibits no edema or tenderness.  Neurological: She is alert and oriented to person, place, and time. She has normal reflexes. No cranial nerve deficit.  Skin: Skin is warm and dry.  Psychiatric: She has a normal mood and affect. Her behavior is normal. Judgment and thought content normal.  Vitals reviewed.     BP 119/85   Pulse 68   Temp (!) 96.8  F (36 C) (Oral)   Ht '5\' 4"'  (1.626 m)   Wt 183 lb 3.2 oz (83.1 kg)   BMI 31.45 kg/m      Assessment & Plan:  Katelyn Waller comes in today with chief complaint of Annual Exam and ADHD (refills)   Diagnosis and orders addressed:  1. Annual physical exam - Anemia Profile B - CMP14+EGFR - Lipid panel - TSH - VITAMIN D 25 Hydroxy (Vit-D Deficiency, Fractures)  2. Attention deficit hyperactivity disorder (ADHD), unspecified ADHD type - CMP14+EGFR - ToxASSURE Select 13 (MW), Urine - lisdexamfetamine (VYVANSE) 40 MG capsule; Take 1 capsule (40 mg total) by mouth every morning.  Dispense: 30 capsule; Refill: 0 - lisdexamfetamine (VYVANSE) 40 MG capsule; Take 1 capsule (40 mg total) by mouth every morning.  Dispense: 30 capsule; Refill: 0 - lisdexamfetamine (VYVANSE) 40 MG capsule; Take 1 capsule (40 mg total) by mouth every morning.  Dispense: 30 capsule; Refill: 0  3. Controlled substance agreement signed - CMP14+EGFR - ToxASSURE Select 13 (MW), Urine - lisdexamfetamine (VYVANSE) 40 MG capsule; Take 1 capsule (40 mg total) by mouth every morning.  Dispense: 30 capsule; Refill: 0 - lisdexamfetamine (VYVANSE) 40 MG capsule; Take 1 capsule (40 mg total) by mouth every morning.  Dispense: 30 capsule; Refill: 0 - lisdexamfetamine (VYVANSE) 40 MG capsule; Take 1 capsule (40 mg total) by mouth every morning.  Dispense: 30 capsule; Refill: 0  4. Fatigue, unspecified type - Anemia Profile B - CMP14+EGFR - TSH - VITAMIN D 25 Hydroxy (Vit-D Deficiency, Fractures)   Labs pending Pt reviewed in [place] controlled database, no red flags noted. Health Maintenance reviewed Diet and exercise encouraged  Follow up plan: 3 months    Evelina Dun, FNP

## 2018-05-26 NOTE — Patient Instructions (Signed)

## 2018-05-27 LAB — CMP14+EGFR
ALBUMIN: 4.6 g/dL (ref 3.5–5.5)
ALK PHOS: 48 IU/L (ref 39–117)
ALT: 20 IU/L (ref 0–32)
AST: 22 IU/L (ref 0–40)
Albumin/Globulin Ratio: 2 (ref 1.2–2.2)
BILIRUBIN TOTAL: 0.5 mg/dL (ref 0.0–1.2)
BUN / CREAT RATIO: 15 (ref 9–23)
BUN: 13 mg/dL (ref 6–20)
CHLORIDE: 102 mmol/L (ref 96–106)
CO2: 23 mmol/L (ref 20–29)
Calcium: 9.6 mg/dL (ref 8.7–10.2)
Creatinine, Ser: 0.85 mg/dL (ref 0.57–1.00)
GFR calc Af Amer: 104 mL/min/{1.73_m2} (ref >59)
GFR calc non Af Amer: 90 mL/min/{1.73_m2} (ref >59)
GLOBULIN, TOTAL: 2.3 g/dL (ref 1.5–4.5)
GLUCOSE: 56 mg/dL — AB (ref 65–99)
POTASSIUM: 4.5 mmol/L (ref 3.5–5.2)
SODIUM: 142 mmol/L (ref 134–144)
Total Protein: 6.9 g/dL (ref 6.0–8.5)

## 2018-05-27 LAB — ANEMIA PROFILE B
BASOS: 0 %
Basophils Absolute: 0 10*3/uL (ref 0.0–0.2)
EOS (ABSOLUTE): 0.1 10*3/uL (ref 0.0–0.4)
Eos: 1 %
FERRITIN: 41 ng/mL (ref 15–150)
FOLATE: 10 ng/mL (ref >3.0)
HEMATOCRIT: 43.4 % (ref 34.0–46.6)
Hemoglobin: 14.3 g/dL (ref 11.1–15.9)
IRON SATURATION: 39 % (ref 15–55)
IRON: 130 ug/dL (ref 27–159)
Immature Grans (Abs): 0 10*3/uL (ref 0.0–0.1)
Immature Granulocytes: 0 %
LYMPHS ABS: 2 10*3/uL (ref 0.7–3.1)
Lymphs: 30 %
MCH: 32.1 pg (ref 26.6–33.0)
MCHC: 32.9 g/dL (ref 31.5–35.7)
MCV: 98 fL — AB (ref 79–97)
MONOCYTES: 9 %
MONOS ABS: 0.6 10*3/uL (ref 0.1–0.9)
NEUTROS ABS: 4.1 10*3/uL (ref 1.4–7.0)
Neutrophils: 60 %
Platelets: 270 10*3/uL (ref 150–450)
RBC: 4.45 x10E6/uL (ref 3.77–5.28)
RDW: 12.5 % (ref 12.3–15.4)
Retic Ct Pct: 1.9 % (ref 0.6–2.6)
TIBC: 334 ug/dL (ref 250–450)
UIBC: 204 ug/dL (ref 131–425)
VITAMIN B 12: 467 pg/mL (ref 232–1245)
WBC: 6.8 10*3/uL (ref 3.4–10.8)

## 2018-05-27 LAB — LIPID PANEL
CHOLESTEROL TOTAL: 157 mg/dL (ref 100–199)
Chol/HDL Ratio: 2.7 ratio (ref 0.0–4.4)
HDL: 59 mg/dL (ref >39)
LDL Calculated: 80 mg/dL (ref 0–99)
Triglycerides: 88 mg/dL (ref 0–149)
VLDL Cholesterol Cal: 18 mg/dL (ref 5–40)

## 2018-05-27 LAB — VITAMIN D 25 HYDROXY (VIT D DEFICIENCY, FRACTURES): Vit D, 25-Hydroxy: 47.3 ng/mL (ref 30.0–100.0)

## 2018-05-27 LAB — TSH: TSH: 2.37 u[IU]/mL (ref 0.450–4.500)

## 2018-06-02 LAB — TOXASSURE SELECT 13 (MW), URINE

## 2018-09-04 ENCOUNTER — Ambulatory Visit: Payer: BLUE CROSS/BLUE SHIELD | Admitting: Family

## 2018-10-23 ENCOUNTER — Encounter: Payer: Self-pay | Admitting: *Deleted

## 2018-10-23 ENCOUNTER — Telehealth: Payer: Self-pay | Admitting: *Deleted

## 2018-10-23 MED ORDER — OSELTAMIVIR PHOSPHATE 75 MG PO CAPS: 75.0000 mg | ORAL_CAPSULE | Freq: Every day | ORAL | 0 refills | Status: DC

## 2018-10-23 NOTE — Telephone Encounter (Signed)
Start taking tamiflu daily unless symptoms develop then increase to BID until rx is complete.

## 2018-10-23 NOTE — Telephone Encounter (Signed)
Pt is going to [place] on Wednesday Family member DXed with the flu Can pt get Tamiflu for prophylaxsis Please advise

## 2018-10-23 NOTE — Telephone Encounter (Signed)
Patient aware.

## 2018-11-20 ENCOUNTER — Other Ambulatory Visit: Payer: Self-pay

## 2018-11-20 ENCOUNTER — Telehealth (INDEPENDENT_AMBULATORY_CARE_PROVIDER_SITE_OTHER): Payer: BLUE CROSS/BLUE SHIELD | Admitting: Family

## 2018-11-20 DIAGNOSIS — F909 Attention-deficit hyperactivity disorder, unspecified type: Secondary | ICD-10-CM | POA: Diagnosis not present

## 2018-11-20 DIAGNOSIS — Z79899 Other long term (current) drug therapy: Secondary | ICD-10-CM

## 2018-11-20 MED ORDER — LISDEXAMFETAMINE DIMESYLATE 40 MG PO CAPS: 40.0000 mg | ORAL_CAPSULE | ORAL | 0 refills | Status: DC

## 2018-11-20 NOTE — Progress Notes (Signed)
Telephone visit  Subjective: GG:YIRS refill PCP: Junie Spencer, FNP WNI:OEVO LAKOTA OLGUIN is a 33 y.o. female calls for telephone consult today. Patient provides verbal consent for consult held via phone.  Location of patient: Home Location of provider: [facility] Others present for call: None  1. ADHD Visit for chronic refill of ADHD medication. PT currently taking Vyvanse 40 mg everyday. Helps staying focused and staying on tasks. She denies any adverse effects such as weight loss or mood swings.    ROS: Per HPI  Allergies  Allergen Reactions  . Sulfa Antibiotics Rash and Hives   Past Medical History:  Diagnosis Date  . ADHD     Current Outpatient Medications:  .  lisdexamfetamine (VYVANSE) 40 MG capsule, Take 1 capsule (40 mg total) by mouth every morning., Disp: 30 capsule, Rfl: 0 .  lisdexamfetamine (VYVANSE) 40 MG capsule, Take 1 capsule (40 mg total) by mouth every morning., Disp: 30 capsule, Rfl: 0 .  lisdexamfetamine (VYVANSE) 40 MG capsule, Take 1 capsule (40 mg total) by mouth every morning., Disp: 30 capsule, Rfl: 0  Assessment/ Plan: 33 y.o. female   KEASHIA PRINE comes in today with chief complaint of No chief complaint on file.   Diagnosis and orders addressed:  1. Attention deficit hyperactivity disorder (ADHD), unspecified ADHD type Meds as prescribed Behavior modification as needed Follow-up for recheck in 3 months - lisdexamfetamine (VYVANSE) 40 MG capsule; Take 1 capsule (40 mg total) by mouth every morning.  Dispense: 30 capsule; Refill: 0 - lisdexamfetamine (VYVANSE) 40 MG capsule; Take 1 capsule (40 mg total) by mouth every morning.  Dispense: 30 capsule; Refill: 0 - lisdexamfetamine (VYVANSE) 40 MG capsule; Take 1 capsule (40 mg total) by mouth every morning.  Dispense: 30 capsule; Refill: 0  2. Controlled substance agreement signed - lisdexamfetamine (VYVANSE) 40 MG capsule; Take 1 capsule (40 mg total) by mouth every morning.  Dispense: 30  capsule; Refill: 0 - lisdexamfetamine (VYVANSE) 40 MG capsule; Take 1 capsule (40 mg total) by mouth every morning.  Dispense: 30 capsule; Refill: 0 - lisdexamfetamine (VYVANSE) 40 MG capsule; Take 1 capsule (40 mg total) by mouth every morning.  Dispense: 30 capsule; Refill: 0    Start time: 12:03 pm End time: 12:08pm  No orders of the defined types were placed in this encounter.   Jannifer Rodney, FNP [facility] [phone]

## 2018-11-22 ENCOUNTER — Telehealth: Payer: Self-pay | Admitting: *Deleted

## 2018-11-22 DIAGNOSIS — F909 Attention-deficit hyperactivity disorder, unspecified type: Secondary | ICD-10-CM

## 2018-11-22 DIAGNOSIS — Z79899 Other long term (current) drug therapy: Secondary | ICD-10-CM

## 2018-11-22 NOTE — Telephone Encounter (Signed)
Vyvanse scripts written on 11/20/2018 but the pharmacy didn't receive them . They were set to normal and [facility] is listed, but there is no transmission information at the bottom. There may have been a glitch in the system.   Will ask provider to resend those 3 and I will notify the patient.   Demetrios Loll, RN

## 2018-11-23 MED ORDER — LISDEXAMFETAMINE DIMESYLATE 40 MG PO CAPS: 40.0000 mg | ORAL_CAPSULE | ORAL | 0 refills | Status: DC

## 2018-11-23 NOTE — Telephone Encounter (Signed)
Prescription sent to pharmacy.

## 2020-03-19 ENCOUNTER — Encounter: Payer: Self-pay | Admitting: Emergency Medicine

## 2020-03-19 ENCOUNTER — Other Ambulatory Visit: Payer: Self-pay

## 2020-03-19 ENCOUNTER — Ambulatory Visit
Admission: EM | Admit: 2020-03-19 | Discharge: 2020-03-19 | Disposition: A | Discharge status: Home / Self Care | Payer: 59 | Attending: Family Medicine | Admitting: Family Medicine

## 2020-03-19 DIAGNOSIS — H60502 Unspecified acute noninfective otitis externa, left ear: Secondary | ICD-10-CM | POA: Diagnosis not present

## 2020-03-19 DIAGNOSIS — H6690 Otitis media, unspecified, unspecified ear: Secondary | ICD-10-CM

## 2020-03-19 MED ORDER — CIPROFLOXACIN-DEXAMETHASONE 0.3-0.1 % OT SUSP: 4.0000 [drp] | Freq: Two times a day (BID) | OTIC | 0 refills | Status: AC

## 2020-03-19 MED ORDER — AMOXICILLIN 875 MG PO TABS: 875.0000 mg | ORAL_TABLET | Freq: Two times a day (BID) | ORAL | 0 refills | Status: DC

## 2020-03-19 NOTE — ED Provider Notes (Signed)
[facility] ____________________________________________  Time seen: Approximately 12:37 PM  I have reviewed the triage vital signs and the nursing notes.   HISTORY  Chief Complaint Otalgia (left)   HPI Katelyn Waller is a 34 y.o. female present for evaluation of left ear discomfort present for the last 4 days.  States gradual in onset.  States ear feels somewhat itchy and tender.  States feels moist inside her ear.  Did just recently returned from being at the beach.  Denies any other accompanying complaints.  Denies cough, congestion, sore throat, chest pain or shortness of breath or fever.  Denies recurrent history of ear issues.  Reports otherwise doing well.  Denies aggravating or alleviating factors.  Patient's last menstrual period was 03/15/2020 (approximate).    Past Medical History:  Diagnosis Date   ADHD     Patient Active Problem List   Diagnosis Date Noted   Controlled substance agreement signed 05/26/2018   ADHD (attention deficit hyperactivity disorder) 02/12/2015    Past Surgical History:  Procedure Laterality Date   ABDOMINAL SURGERY     Removed tumor     No current facility-administered medications for this encounter.  Current Outpatient Medications:    amoxicillin (AMOXIL) 875 MG tablet, Take 1 tablet (875 mg total) by mouth 2 (two) times daily., Disp: 20 tablet, Rfl: 0   ciprofloxacin-dexamethasone (CIPRODEX) OTIC suspension, Place 4 drops into the left ear 2 (two) times daily for 7 days., Disp: 7.5 mL, Rfl: 0   lisdexamfetamine (VYVANSE) 40 MG capsule, Take 1 capsule (40 mg total) by mouth every morning., Disp: 30 capsule, Rfl: 0   lisdexamfetamine (VYVANSE) 40 MG capsule, Take 1 capsule (40 mg total) by mouth every morning., Disp: 30 capsule, Rfl: 0   lisdexamfetamine (VYVANSE) 40 MG capsule, Take 1 capsule (40 mg total) by mouth every morning., Disp: 30 capsule, Rfl: 0  Allergies Sulfa antibiotics  Family History    Problem Relation Age of Onset   Cancer Mother        thyroid   Cancer Sister     Social History Social History   Tobacco Use   Smoking status: Former Smoker   Smokeless tobacco: Never Used  Building services engineer Use: Never used  Substance Use Topics   Alcohol use: Yes   Drug use: No    Review of Systems Constitutional: No fever/chills Eyes: No visual changes. ENT: No sore throat. As above.  Cardiovascular: Denies chest pain. Respiratory: Denies shortness of breath. Gastrointestinal: No abdominal pain.  No nausea, no vomiting.  No diarrhea.   Genitourinary: Negative for dysuria. Musculoskeletal: Negative for back pain. Skin: Negative for rash.   ____________________________________________   PHYSICAL EXAM:  VITAL SIGNS: ED Triage Vitals  Enc Vitals Group     BP 03/19/20 1052 (!) 127/100     Pulse Rate 03/19/20 1052 84     Resp 03/19/20 1052 18     Temp 03/19/20 1052 97.9 F (36.6 C)     Temp Source 03/19/20 1052 Oral     SpO2 03/19/20 1052 100 %     Weight 03/19/20 1049 183 lb 3.2 oz (83.1 kg)     Height 03/19/20 1049 5\' 4"  (1.626 m)     Head Circumference --      Peak Flow --      Pain Score 03/19/20 1049 8     Pain Loc --      Pain Edu? --      Excl. in GC? --  Constitutional: Alert and oriented. Well appearing and in no acute distress. Eyes: Conjunctivae are normal. PERRL. EOMI. ENT      Head: Normocephalic and atraumatic.      Ears: Right: Nontender, normal canal, no erythema, normal TM.  Left: Mild tenderness auricle movement, canal mildly erythematous and edematous with mild exudate present, able to visualize TM, TM erythema, dull TM.  TMs bilaterally appear intact.      Nose: No congestion/rhinnorhea. Cardiovascular: Normal rate, regular rhythm. Grossly normal heart sounds.  Good peripheral circulation. Respiratory: Normal respiratory effort without tachypnea nor retractions. Breath sounds are clear and equal bilaterally. No wheezes,  rales, rhonchi. Musculoskeletal: Steady gait.  Neurologic:  Normal speech and language. Speech is normal. No gait instability.  Skin:  Skin is warm, dry and intact. No rash noted. Psychiatric: Mood and affect are normal. Speech and behavior are normal. Patient exhibits appropriate insight and judgment   ___________________________________________   LABS (all labs ordered are listed, but only abnormal results are displayed)  Labs Reviewed - No data to display  PROCEDURES  INITIAL IMPRESSION / ASSESSMENT AND PLAN / ED COURSE  Pertinent labs & imaging results that were available during my care of the patient were reviewed by me and considered in my medical decision making (see chart for details).  Well-appearing patient.  No acute distress.  Left otitis externa and left otitis media.  Will treat with oral amoxicillin and Ciprodex.  Supportive care, keep dry.  Monitor.Discussed indication, risks and benefits of medications with patient.   Discussed follow up with Primary care physician this week. Discussed follow up and return parameters including no resolution or any worsening concerns. Patient verbalized understanding and agreed to plan.   ____________________________________________   FINAL CLINICAL IMPRESSION(S) / ED DIAGNOSES  Final diagnoses:  Acute otitis externa of left ear, unspecified type  Acute otitis media, unspecified otitis media type     ED Discharge Orders         Ordered    amoxicillin (AMOXIL) 875 MG tablet  2 times daily     Discontinue  Reprint     03/19/20 1114    ciprofloxacin-dexamethasone (CIPRODEX) OTIC suspension  2 times daily     Discontinue  Reprint     03/19/20 1114           Note: This dictation was prepared with Dragon dictation along with smaller phrase technology. Any transcriptional errors that result from this process are unintentional.         Renford Dills, NP 03/19/20 1254

## 2020-03-19 NOTE — Discharge Instructions (Addendum)
Take medication as prescribed. Rest. Drink plenty of fluids. Keep ear dry. ° °Follow up with your primary care physician this week as needed. Return to Urgent care for new or worsening concerns.  ° °

## 2020-03-19 NOTE — ED Triage Notes (Signed)
Pt c/o left ear pain. Started about 4 days ago. Denies fever.  

## 2020-03-26 ENCOUNTER — Ambulatory Visit
Admission: RE | Admit: 2020-03-26 | Discharge: 2020-03-26 | Disposition: A | Discharge status: Home / Self Care | Payer: 59 | Source: Ambulatory Visit | Attending: Family Medicine | Admitting: Family Medicine

## 2020-03-26 ENCOUNTER — Other Ambulatory Visit: Payer: Self-pay

## 2020-03-26 VITALS — BP 124/87 | HR 86 | Temp 97.9°F | Resp 16

## 2020-03-26 DIAGNOSIS — H6693 Otitis media, unspecified, bilateral: Secondary | ICD-10-CM

## 2020-03-26 DIAGNOSIS — R21 Rash and other nonspecific skin eruption: Secondary | ICD-10-CM | POA: Diagnosis not present

## 2020-03-26 DIAGNOSIS — T50905A Adverse effect of unspecified drugs, medicaments and biological substances, initial encounter: Secondary | ICD-10-CM | POA: Diagnosis not present

## 2020-03-26 MED ORDER — PREDNISONE 10 MG (21) PO TBPK: ORAL_TABLET | Freq: Every day | ORAL | 0 refills | Status: AC

## 2020-03-26 MED ORDER — TRIAMCINOLONE ACETONIDE 0.1 % EX CREA: 1.0000 "application " | TOPICAL_CREAM | Freq: Two times a day (BID) | CUTANEOUS | 0 refills | Status: DC

## 2020-03-26 MED ORDER — DEXAMETHASONE SODIUM PHOSPHATE 10 MG/ML IJ SOLN
10.0000 mg | Freq: Once | INTRAMUSCULAR | Status: AC
  Administered 2020-03-26: 10 mg via INTRAMUSCULAR

## 2020-03-26 MED ORDER — CEFDINIR 300 MG PO CAPS: 300.0000 mg | ORAL_CAPSULE | Freq: Two times a day (BID) | ORAL | 0 refills | Status: AC

## 2020-03-26 NOTE — Discharge Instructions (Addendum)
You still have an ear infection  I have sent in Cefdinir for  you to take twice a day for 10 days  You have received Decadron in the office today  I have sent in a prednisone taper for you to take for 6 days. 6 tablets on day one, 5 tablets on day two, 4 tablets on day three, 3 tablets on day four, 2 tablets on day five, and 1 tablet on day six.  I have also sent in triamcinolone cream for you to use on the areas of the itchy rash.  Follow up with this office or with primary care for a recheck on your ears or any other issues with medications  Follow up in the ER for high fever, trouble swallowing, trouble breathing, other concerning symptoms

## 2020-03-26 NOTE — ED Provider Notes (Signed)
[facility]   [id number] 03/26/20 Arrival Time: 1151  CC: RASH  SUBJECTIVE:  Katelyn Waller is a 35 y.o. female who presents with a skin rash that began about 2 days ago. Reports that the rash is red, itchy and present all over her body. Reports that she is taking amoxicillin for an ear infection. Reports that this rash occurred when she received amoxicillin when she was pregnant as well. Reports that her ears are still hurting her. Has tried OTC cortisone cream for the rash with little relief. Denies changes in soaps, detergents, close contacts with similar rash, known trigger or environmental trigger, allergy. Denies fever, chills, nausea, vomiting, erythema, swelling, discharge, oral lesions, SOB, chest pain, abdominal pain, changes in bowel or bladder function.    ROS: As per HPI.  All other pertinent ROS negative.     Past Medical History:  Diagnosis Date  . ADHD    Past Surgical History:  Procedure Laterality Date  . ABDOMINAL SURGERY     Removed tumor   Allergies  Allergen Reactions  . Sulfa Antibiotics Rash and Hives  . Amoxicillin Rash   No current facility-administered medications on file prior to encounter.   Current Outpatient Medications on File Prior to Encounter  Medication Sig Dispense Refill  . amoxicillin (AMOXIL) 875 MG tablet Take 1 tablet (875 mg total) by mouth 2 (two) times daily. (Patient not taking: Reported on 03/26/2020) 20 tablet 0  . ciprofloxacin-dexamethasone (CIPRODEX) OTIC suspension Place 4 drops into the left ear 2 (two) times daily for 7 days. 7.5 mL 0  . lisdexamfetamine (VYVANSE) 40 MG capsule Take 1 capsule (40 mg total) by mouth every morning. (Patient not taking: Reported on 03/26/2020) 30 capsule 0  . lisdexamfetamine (VYVANSE) 40 MG capsule Take 1 capsule (40 mg total) by mouth every morning. 30 capsule 0  . lisdexamfetamine (VYVANSE) 40 MG capsule Take 1 capsule (40 mg total) by mouth every morning. 30 capsule 0   Social  History   Socioeconomic History  . Marital status: Married    Spouse name: Not on file  . Number of children: Not on file  . Years of education: Not on file  . Highest education level: Not on file  Occupational History  . Not on file  Tobacco Use  . Smoking status: Former Games developer  . Smokeless tobacco: Never Used  Vaping Use  . Vaping Use: Never used  Substance and Sexual Activity  . Alcohol use: Yes  . Drug use: No  . Sexual activity: Not on file  Other Topics Concern  . Not on file  Social History Narrative  . Not on file   Social Determinants of Health   Financial Resource Strain:   . Difficulty of Paying Living Expenses:   Food Insecurity:   . Worried About Programme researcher, broadcasting/film/video in the Last Year:   . Barista in the Last Year:   Transportation Needs:   . Freight forwarder (Medical):   Marland Kitchen Lack of Transportation (Non-Medical):   Physical Activity:   . Days of Exercise per Week:   . Minutes of Exercise per Session:   Stress:   . Feeling of Stress :   Social Connections:   . Frequency of Communication with Friends and Family:   . Frequency of Social Gatherings with Friends and Family:   . Attends Religious Services:   . Active Member of Clubs or Organizations:   . Attends Banker Meetings:   .  Marital Status:   Intimate Partner Violence:   . Fear of Current or Ex-Partner:   . Emotionally Abused:   Marland Kitchen Physically Abused:   . Sexually Abused:    Family History  Problem Relation Age of Onset  . Cancer Mother        thyroid  . Cancer Sister     OBJECTIVE: Vitals:   03/26/20 1200  BP: (!) 124/87  Pulse: 86  Resp: 16  Temp: 97.9 F (36.6 C)  TempSrc: Oral  SpO2: 98%    General appearance: alert; no distress Head: NCAT Lungs: clear to auscultation bilaterally Heart: regular rate and rhythm.  Radial pulse 2+ bilaterally Extremities: no edema Skin: warm and dry; erythematous, urticarial maculopapular generalized rash Psychological:  alert and cooperative; normal mood and affect  ASSESSMENT & PLAN:  1. Bilateral otitis media, unspecified otitis media type   2. Adverse effect of drug, initial encounter   3. Rash and nonspecific skin eruption     Meds ordered this encounter  Medications  . dexamethasone (DECADRON) injection 10 mg  . cefdinir (OMNICEF) 300 MG capsule    Sig: Take 1 capsule (300 mg total) by mouth 2 (two) times daily for 10 days.    Dispense:  20 capsule    Refill:  0    Order Specific Question:   Supervising Provider    Answer:   Merrilee Jansky [id number]  . predniSONE (STERAPRED UNI-PAK 21 TAB) 10 MG (21) TBPK tablet    Sig: Take by mouth daily for 6 days. Take 6 tablets on day 1, 5 tablets on day 2, 4 tablets on day 3, 3 tablets on day 4, 2 tablets on day 5, 1 tablet on day 6    Dispense:  21 tablet    Refill:  0    Order Specific Question:   Supervising Provider    Answer:   Merrilee Jansky [id number]  . triamcinolone cream (KENALOG) 0.1 %    Sig: Apply 1 application topically 2 (two) times daily.    Dispense:  30 g    Refill:  0    Order Specific Question:   Supervising Provider    Answer:   Merrilee Jansky [[id number]]    Decadron 10mg  in office today STOP amoxicillin Prescribed Cefdinir  Prescribed prednisone taper Triamcinolone 0.1% (corticosteroid - itch/ inflammation relief) Take as prescribed and to completion Avoid hot showers/ baths Moisturize skin daily  Follow up with PCP if symptoms persists Return or go to the ER if you have any new or worsening symptoms such as fever, chills, nausea, vomiting, redness, swelling, discharge, if symptoms do not improve with medications  Reviewed expectations re: course of current medical issues. Questions answered. Outlined signs and symptoms indicating need for more acute intervention. Patient verbalized understanding. After Visit Summary given.   , NP 03/26/20 1225

## 2020-03-26 NOTE — ED Triage Notes (Addendum)
Pt presents to UC for rash on chest, abdomen, legs, arms, and back. Pt was seen at [facility] for possible ear/sinus infection prescribed amoxicillin and believes rash might be a reaction.   Pt denies itchy throat, difficulty breathing.  Pt has been treating with zyrtec and benadhryl   Pt denies pain or drainage at rash site. Only complaint is itching.

## 2020-08-17 LAB — HM PAP SMEAR: HM Pap smear: NORMAL

## 2020-08-19 ENCOUNTER — Encounter: Payer: Self-pay | Admitting: Family

## 2020-08-19 ENCOUNTER — Other Ambulatory Visit: Payer: Self-pay

## 2020-08-19 ENCOUNTER — Ambulatory Visit (INDEPENDENT_AMBULATORY_CARE_PROVIDER_SITE_OTHER): Payer: 59 | Admitting: Family

## 2020-08-19 DIAGNOSIS — Z79899 Other long term (current) drug therapy: Secondary | ICD-10-CM | POA: Diagnosis not present

## 2020-08-19 DIAGNOSIS — F909 Attention-deficit hyperactivity disorder, unspecified type: Secondary | ICD-10-CM

## 2020-08-19 MED ORDER — LISDEXAMFETAMINE DIMESYLATE 40 MG PO CAPS: 40.0000 mg | ORAL_CAPSULE | ORAL | 0 refills | Status: DC

## 2020-08-19 NOTE — Patient Instructions (Signed)

## 2020-08-19 NOTE — Progress Notes (Signed)
Subjective:    Patient ID: Katelyn Waller, female    DOB: [birth year], 34 y.o.   MRN: [id number]  Chief Complaint  Patient presents with  . ADHD    HPI PT presents to the office for refill of ADHD medication. PT currently taking Vyvanse 40 mg everyday. Helps staying focused and staying on tasks. She denies any adverse effects such as weight loss or mood swings.    Review of Systems  All other systems reviewed and are negative.      Objective:   Physical Exam Vitals reviewed.  Constitutional:      General: She is not in acute distress.    Appearance: She is well-developed and well-nourished.  HENT:     Head: Normocephalic and atraumatic.     Mouth/Throat:     Mouth: Oropharynx is clear and moist.  Eyes:     Pupils: Pupils are equal, round, and reactive to light.  Neck:     Thyroid: No thyromegaly.  Cardiovascular:     Rate and Rhythm: Normal rate and regular rhythm.     Pulses: Intact distal pulses.     Heart sounds: Normal heart sounds. No murmur heard.   Pulmonary:     Effort: Pulmonary effort is normal. No respiratory distress.     Breath sounds: Normal breath sounds. No wheezing.  Abdominal:     General: Bowel sounds are normal. There is no distension.     Palpations: Abdomen is soft.     Tenderness: There is no abdominal tenderness.  Musculoskeletal:        General: No tenderness or edema. Normal range of motion.     Cervical back: Normal range of motion and neck supple.  Skin:    General: Skin is warm and dry.  Neurological:     Mental Status: She is alert and oriented to person, place, and time.     Cranial Nerves: No cranial nerve deficit.     Deep Tendon Reflexes: Reflexes are normal and symmetric.  Psychiatric:        Mood and Affect: Mood and affect normal.        Behavior: Behavior normal.        Thought Content: Thought content normal.        Judgment: Judgment normal.       BP 135/89   Pulse 90   Temp 97.9 F (36.6 C) (Temporal)   Ht  5\' 4"  (1.626 m)   Wt 205 lb (93 kg)   BMI 35.19 kg/m      Assessment & Plan:  Katelyn Waller comes in today with chief complaint of ADHD   Diagnosis and orders addressed:  1. Attention deficit hyperactivity disorder (ADHD), unspecified ADHD type Meds as prescribed Behavior modification as needed Follow-up for recheck in 3 months Patient reviewed in [place] controlled database, no flags noted. Contract and drug screen are up to date.  - lisdexamfetamine (VYVANSE) 40 MG capsule; Take 1 capsule (40 mg total) by mouth every morning.  Dispense: 30 capsule; Refill: 0 - lisdexamfetamine (VYVANSE) 40 MG capsule; Take 1 capsule (40 mg total) by mouth every morning.  Dispense: 30 capsule; Refill: 0 - lisdexamfetamine (VYVANSE) 40 MG capsule; Take 1 capsule (40 mg total) by mouth every morning.  Dispense: 30 capsule; Refill: 0 - ToxASSURE Select 13 (MW), Urine  2. Controlled substance agreement signed - lisdexamfetamine (VYVANSE) 40 MG capsule; Take 1 capsule (40 mg total) by mouth every morning.  Dispense: 30 capsule; Refill: 0 -  lisdexamfetamine (VYVANSE) 40 MG capsule; Take 1 capsule (40 mg total) by mouth every morning.  Dispense: 30 capsule; Refill: 0 - lisdexamfetamine (VYVANSE) 40 MG capsule; Take 1 capsule (40 mg total) by mouth every morning.  Dispense: 30 capsule; Refill: 0 - ToxASSURE Select 13 (MW), Urine   Jannifer Rodney, FNP

## 2020-08-27 LAB — TOXASSURE SELECT 13 (MW), URINE

## 2020-11-17 ENCOUNTER — Ambulatory Visit (INDEPENDENT_AMBULATORY_CARE_PROVIDER_SITE_OTHER): Payer: 59 | Admitting: Family

## 2020-11-17 ENCOUNTER — Encounter: Payer: Self-pay | Admitting: Family

## 2020-11-17 ENCOUNTER — Other Ambulatory Visit: Payer: Self-pay

## 2020-11-17 DIAGNOSIS — Z79899 Other long term (current) drug therapy: Secondary | ICD-10-CM

## 2020-11-17 DIAGNOSIS — F909 Attention-deficit hyperactivity disorder, unspecified type: Secondary | ICD-10-CM

## 2020-11-17 MED ORDER — LISDEXAMFETAMINE DIMESYLATE 40 MG PO CAPS
40.0000 mg | ORAL_CAPSULE | ORAL | 0 refills | Status: DC
Start: 2020-11-17 — End: 2021-03-06

## 2020-11-17 MED ORDER — LISDEXAMFETAMINE DIMESYLATE 40 MG PO CAPS: 40.0000 mg | ORAL_CAPSULE | ORAL | 0 refills | Status: DC

## 2020-11-17 MED ORDER — LISDEXAMFETAMINE DIMESYLATE 40 MG PO CAPS
40.0000 mg | ORAL_CAPSULE | ORAL | 0 refills | Status: DC
Start: 2020-11-17 — End: 2020-11-17

## 2020-11-17 NOTE — Patient Instructions (Signed)

## 2020-11-17 NOTE — Progress Notes (Signed)
Subjective:    Patient ID: Katelyn Waller, female    DOB: 1985/10/11, 35 y.o.   MRN: [id number]  Chief Complaint  Patient presents with  . ADHD    HPI PT presents to the office for refill of ADHD medication. PT currently taking Vyvanse 40 mg everyday. Helps staying focused and staying on tasks. She denies any adverse effects such as weight loss or mood swings.   Review of Systems  All other systems reviewed and are negative.      Objective:   Physical Exam Vitals reviewed.  Constitutional:      General: She is not in acute distress.    Appearance: She is well-developed.  HENT:     Head: Normocephalic and atraumatic.     Right Ear: Tympanic membrane normal.     Left Ear: Tympanic membrane normal.  Eyes:     Pupils: Pupils are equal, round, and reactive to light.  Neck:     Thyroid: No thyromegaly.  Cardiovascular:     Rate and Rhythm: Normal rate and regular rhythm.     Heart sounds: Normal heart sounds. No murmur heard.   Pulmonary:     Effort: Pulmonary effort is normal. No respiratory distress.     Breath sounds: Normal breath sounds. No wheezing.  Abdominal:     General: Bowel sounds are normal. There is no distension.     Palpations: Abdomen is soft.     Tenderness: There is no abdominal tenderness.  Musculoskeletal:        General: No tenderness. Normal range of motion.     Cervical back: Normal range of motion and neck supple.  Skin:    General: Skin is warm and dry.  Neurological:     Mental Status: She is alert and oriented to person, place, and time.     Cranial Nerves: No cranial nerve deficit.     Deep Tendon Reflexes: Reflexes are normal and symmetric.  Psychiatric:        Behavior: Behavior normal.        Thought Content: Thought content normal.        Judgment: Judgment normal.       BP 124/89   Pulse 86   Temp 97.9 F (36.6 C) (Temporal)   Ht 5\' 4"  (1.626 m)   Wt 205 lb 6.4 oz (93.2 kg)   BMI 35.26 kg/m      Assessment &  Plan:  EMALEY APPLIN comes in today with chief complaint of ADHD   Diagnosis and orders addressed:  1. Attention deficit hyperactivity disorder (ADHD), unspecified ADHD type Meds as prescribed Behavior modification as needed Follow-up for recheck in 3 months - lisdexamfetamine (VYVANSE) 40 MG capsule; Take 1 capsule (40 mg total) by mouth every morning.  Dispense: 30 capsule; Refill: 0 - lisdexamfetamine (VYVANSE) 40 MG capsule; Take 1 capsule (40 mg total) by mouth every morning.  Dispense: 30 capsule; Refill: 0 - lisdexamfetamine (VYVANSE) 40 MG capsule; Take 1 capsule (40 mg total) by mouth every morning.  Dispense: 30 capsule; Refill: 0  2. Controlled substance agreement signed - lisdexamfetamine (VYVANSE) 40 MG capsule; Take 1 capsule (40 mg total) by mouth every morning.  Dispense: 30 capsule; Refill: 0 - lisdexamfetamine (VYVANSE) 40 MG capsule; Take 1 capsule (40 mg total) by mouth every morning.  Dispense: 30 capsule; Refill: 0 - lisdexamfetamine (VYVANSE) 40 MG capsule; Take 1 capsule (40 mg total) by mouth every morning.  Dispense: 30 capsule; Refill: 0  Evelina Dun, FNP

## 2021-02-24 ENCOUNTER — Ambulatory Visit: Payer: 59 | Admitting: Family

## 2021-02-24 ENCOUNTER — Encounter: Payer: Self-pay | Admitting: Family

## 2021-03-06 ENCOUNTER — Encounter: Payer: Self-pay | Admitting: Family

## 2021-03-06 ENCOUNTER — Ambulatory Visit (INDEPENDENT_AMBULATORY_CARE_PROVIDER_SITE_OTHER): Payer: 59 | Admitting: Family

## 2021-03-06 DIAGNOSIS — F909 Attention-deficit hyperactivity disorder, unspecified type: Secondary | ICD-10-CM

## 2021-03-06 DIAGNOSIS — Z79899 Other long term (current) drug therapy: Secondary | ICD-10-CM | POA: Diagnosis not present

## 2021-03-06 MED ORDER — LISDEXAMFETAMINE DIMESYLATE 50 MG PO CAPS: 50.0000 mg | ORAL_CAPSULE | Freq: Every day | ORAL | 0 refills | Status: DC

## 2021-03-06 NOTE — Progress Notes (Signed)
   Virtual Visit  Note Due to COVID-19 pandemic this visit was conducted virtually. This visit type was conducted due to national recommendations for restrictions regarding the COVID-19 Pandemic (e.g. social distancing, sheltering in place) in an effort to limit this patient's exposure and mitigate transmission in our community. All issues noted in this document were discussed and addressed.  A physical exam was not performed with this format.  I connected with Katelyn Waller on 03/06/21 at 12:48 pm  by telephone and verified that I am speaking with the correct person using two identifiers. Katelyn Waller is currently located at home and no one is currently with her during visit. The provider, Jannifer Rodney, FNP is located in their office at time of visit.  I discussed the limitations, risks, security and privacy concerns of performing an evaluation and management service by telephone and the availability of in person appointments. I also discussed with the patient that there may be a patient responsible charge related to this service. The patient expressed understanding and agreed to proceed.   History and Present Illness:  HPI PT presents to the office for refill of ADHD medication. PT currently taking Vyvanse 40 mg everyday. States she is having a hard time staying focused and on task. She would like to try to increase her dose.  She denies any adverse effects such as weight loss or mood swings.    Review of Systems  All other systems reviewed and are negative.   Observations/Objective: No SOB or distress noted  Assessment and Plan: 1. Attention deficit hyperactivity disorder (ADHD), unspecified ADHD type - lisdexamfetamine (VYVANSE) 50 MG capsule; Take 1 capsule (50 mg total) by mouth daily before breakfast.  Dispense: 30 capsule; Refill: 0 - lisdexamfetamine (VYVANSE) 50 MG capsule; Take 1 capsule (50 mg total) by mouth daily before breakfast.  Dispense: 30 capsule; Refill: 0 -  lisdexamfetamine (VYVANSE) 50 MG capsule; Take 1 capsule (50 mg total) by mouth daily before breakfast.  Dispense: 30 capsule; Refill: 0  2. Controlled substance agreement signed - lisdexamfetamine (VYVANSE) 50 MG capsule; Take 1 capsule (50 mg total) by mouth daily before breakfast.  Dispense: 30 capsule; Refill: 0 - lisdexamfetamine (VYVANSE) 50 MG capsule; Take 1 capsule (50 mg total) by mouth daily before breakfast.  Dispense: 30 capsule; Refill: 0 - lisdexamfetamine (VYVANSE) 50 MG capsule; Take 1 capsule (50 mg total) by mouth daily before breakfast.  Dispense: 30 capsule; Refill: 0  Meds as prescribed, increased Vyvanse to 50 mg from 40 mg Behavior modification as needed Follow-up for recheck in 10months    I discussed the assessment and treatment plan with the patient. The patient was provided an opportunity to ask questions and all were answered. The patient agreed with the plan and demonstrated an understanding of the instructions.   The patient was advised to call back or seek an in-person evaluation if the symptoms worsen or if the condition fails to improve as anticipated.  The above assessment and management plan was discussed with the patient. The patient verbalized understanding of and has agreed to the management plan. Patient is aware to call the clinic if symptoms persist or worsen. Patient is aware when to return to the clinic for a follow-up visit. Patient educated on when it is appropriate to go to the emergency department.   Time call ended:  12:59 pm   I provided 11 minutes of  non face-to-face time during this encounter.    Jannifer Rodney, FNP

## 2021-03-06 NOTE — Patient Instructions (Signed)
Attention Deficit Hyperactivity Disorder, Adult Attention deficit hyperactivity disorder (ADHD) is a mental health disorder that starts during childhood (neurodevelopmental disorder). For many people with ADHD, the disorder continues into the adult years.Treatment can help you manage your symptoms. What are the causes? The exact cause of ADHD is not known. Most experts believe genetics andenvironmental factors contribute to ADHD. What increases the risk? The following factors may make you more likely to develop this condition: Having a family history of ADHD. Being female. Being born to a mother who smoked or drank alcohol during pregnancy. Being exposed to lead or other toxins in the womb or early in life. Being born before 37 weeks of pregnancy (prematurely) or at a low birth weight. Having experienced a brain injury. What are the signs or symptoms? Symptoms of this condition depend on the type of ADHD. The two main types are inattentive and hyperactive-impulsive. Some people may have symptoms of bothtypes. Symptoms of the inattentive type include: Difficulty paying attention. Making careless mistakes. Not following instructions. Being disorganized. Avoiding tasks that require time and attention. Losing and forgetting things. Being easily distracted. Symptoms of the hyperactive-impulsive type include: Restlessness. Talking too much. Interrupting. Difficulty with: Sitting still. Feeling motivated. Relaxing. Waiting in line or waiting for a turn. In adults, this condition may lead to certain problems, such as: Keeping jobs. Performing tasks at work. Having stable relationships. Being on time or keeping to a schedule. How is this diagnosed? This condition is diagnosed based on your current symptoms and your history of symptoms. The diagnosis can be made by a health care provider such as a primarycare provider or a mental health care specialist. Your health care provider may use a  symptom checklist or a behavior rating scale to evaluate your symptoms. He or she may also want to talk with peoplewho have observed your behaviors throughout your life. How is this treated? This condition can be treated with medicines and behavior therapy. Medicines may be the best option to reduce impulsive behaviors and improve attention. Your health care provider may recommend: Stimulant medicines. These are the most common medicines used for adult ADHD. They affect certain chemicals in the brain (neurotransmitters) and improve your ability to control your symptoms. A non-stimulant medicine for adult ADHD (atomoxetine). This medicine increases a neurotransmitter called norepinephrine. It may take weeks to months to see effects from this medicine. Counseling and behavioral management are also important for treating ADHD. Counseling is often used along with medicine. Your health care provider may suggest: Cognitive behavioral therapy (CBT). This type of therapy teaches you to replace negative thoughts and actions with positive thoughts and actions. When used as part of ADHD treatment, this therapy may also include: Coping strategies for organization, time management, impulse control, and stress reduction. Mindfulness and meditation training. Behavioral management. You may work with a coach who is specially trained to help people with ADHD manage and organize activities and function more effectively. Follow these instructions at home: Medicines  Take over-the-counter and prescription medicines only as told by your health care provider. Talk with your health care provider about the possible side effects of your medicines and how to manage them.  Lifestyle  Do not use drugs. Do not drink alcohol if: Your health care provider tells you not to drink. You are pregnant, may be pregnant, or are planning to become pregnant. If you drink alcohol: Limit how much you use to: 0-1 drink a day for  women. 0-2 drinks a day for men. Be aware   of how much alcohol is in your drink. In the [place] one drink equals one 12 oz bottle of beer (355 mL), one 5 oz glass of wine (148 mL), or one 1 oz glass of hard liquor (44 mL). Get enough sleep. Eat a healthy diet. Exercise regularly. Exercise can help to reduce stress and anxiety.  General instructions Learn as much as you can about adult ADHD, and work closely with your health care providers to find the treatments that work best for you. Follow the same schedule each day. Use reminder devices like notes, calendars, and phone apps to stay on time and organized. Keep all follow-up visits as told by your health care provider and therapist. This is important. Where to find more information A health care provider may be able to recommend resources that are available online or over the phone. You could start with: Attention Deficit Disorder Association (ADDA): www.add.org National Institute of Mental Health (NIMH): www.nimh.nih.gov Contact a health care provider if: Your symptoms continue to cause problems. You have side effects from your medicine, such as: Repeated muscle twitches, coughing, or speech outbursts. Sleep problems. Loss of appetite. Dizziness. Unusually fast heartbeat. Stomach pains. Headaches. You are struggling with anxiety, depression, or substance abuse. Get help right away if you: Have a severe reaction to a medicine. If you ever feel like you may hurt yourself or others, or have thoughts about taking your own life, get help right away. You can go to the nearest emergency department or call: Your local emergency services (911 in the [place] A suicide crisis helpline, such as the National Suicide Prevention Lifeline at 1-800-273-8255. This is open 24 hours a day. Summary ADHD is a mental health disorder that starts during childhood (neurodevelopmental disorder) and often continues into the adult years. The exact cause of ADHD  is not known. Most experts believe genetics and environmental factors contribute to ADHD. There is no cure for ADHD, but treatment with medicine, cognitive behavioral therapy, or behavioral management can help you manage your condition. This information is not intended to replace advice given to you by your health care provider. Make sure you discuss any questions you have with your healthcare provider. Document Revised: 01/08/2019 Document Reviewed: 01/08/2019 Elsevier Patient Education  2022 Elsevier Inc.  

## 2021-09-18 ENCOUNTER — Encounter: Payer: Self-pay | Admitting: Family

## 2021-09-18 ENCOUNTER — Ambulatory Visit (INDEPENDENT_AMBULATORY_CARE_PROVIDER_SITE_OTHER): Payer: 59 | Admitting: Family

## 2021-09-18 VITALS — BP 126/83 | HR 78 | Temp 97.7°F | Ht 64.0 in | Wt 185.4 lb

## 2021-09-18 DIAGNOSIS — F909 Attention-deficit hyperactivity disorder, unspecified type: Secondary | ICD-10-CM

## 2021-09-18 DIAGNOSIS — R69 Illness, unspecified: Secondary | ICD-10-CM | POA: Diagnosis not present

## 2021-09-18 DIAGNOSIS — Z0001 Encounter for general adult medical examination with abnormal findings: Secondary | ICD-10-CM | POA: Diagnosis not present

## 2021-09-18 DIAGNOSIS — Z79899 Other long term (current) drug therapy: Secondary | ICD-10-CM | POA: Diagnosis not present

## 2021-09-18 DIAGNOSIS — Z1159 Encounter for screening for other viral diseases: Secondary | ICD-10-CM

## 2021-09-18 DIAGNOSIS — Z0000 Encounter for general adult medical examination without abnormal findings: Secondary | ICD-10-CM

## 2021-09-18 MED ORDER — LISDEXAMFETAMINE DIMESYLATE 50 MG PO CAPS: 50.0000 mg | ORAL_CAPSULE | Freq: Every day | ORAL | 0 refills | Status: DC

## 2021-09-18 MED ORDER — WEGOVY 0.25 MG/0.5ML ~~LOC~~ SOAJ: 0.2500 mg | SUBCUTANEOUS | 1 refills | Status: DC

## 2021-09-18 NOTE — Progress Notes (Signed)
Subjective:    Patient ID: Katelyn Waller, female    DOB: 1985/10/10, 36 y.o.   MRN: [id number]  Chief Complaint  Patient presents with   Medical Management of Chronic Issues    HPI PT presents to the office for CPE and refill of ADHD medication. PT currently taking Vyvanse 40 mg everyday. States this is working well and helping her stay on task and focused at work.    Review of Systems  All other systems reviewed and are negative.  Family History  Problem Relation Age of Onset   Cancer Mother        thyroid   Cancer Sister    Social History   Socioeconomic History   Marital status: Married    Spouse name: Not on file   Number of children: Not on file   Years of education: Not on file   Highest education level: Not on file  Occupational History   Not on file  Tobacco Use   Smoking status: Former   Smokeless tobacco: Never  Vaping Use   Vaping Use: Never used  Substance and Sexual Activity   Alcohol use: Yes   Drug use: No   Sexual activity: Not on file  Other Topics Concern   Not on file  Social History Narrative   Not on file   Social Determinants of Health   Financial Resource Strain: Not on file  Food Insecurity: Not on file  Transportation Needs: Not on file  Physical Activity: Not on file  Stress: Not on file  Social Connections: Not on file       Objective:   Physical Exam Vitals reviewed.  Constitutional:      General: She is not in acute distress.    Appearance: She is well-developed.  HENT:     Head: Normocephalic and atraumatic.     Right Ear: Tympanic membrane normal.     Left Ear: Tympanic membrane normal.  Eyes:     Pupils: Pupils are equal, round, and reactive to light.  Neck:     Thyroid: No thyromegaly.  Cardiovascular:     Rate and Rhythm: Normal rate and regular rhythm.     Heart sounds: Normal heart sounds. No murmur heard. Pulmonary:     Effort: Pulmonary effort is normal. No respiratory distress.     Breath sounds:  Normal breath sounds. No wheezing.  Abdominal:     General: Bowel sounds are normal. There is no distension.     Palpations: Abdomen is soft.     Tenderness: There is no abdominal tenderness.  Musculoskeletal:        General: No tenderness. Normal range of motion.     Cervical back: Normal range of motion and neck supple.  Skin:    General: Skin is warm and dry.  Neurological:     Mental Status: She is alert and oriented to person, place, and time.     Cranial Nerves: No cranial nerve deficit.     Deep Tendon Reflexes: Reflexes are normal and symmetric.  Psychiatric:        Behavior: Behavior normal.        Thought Content: Thought content normal.        Judgment: Judgment normal.     BP 126/83    Pulse 78    Temp 97.7 F (36.5 C) (Temporal)    Ht '5\' 4"'  (1.626 m)    Wt 185 lb 6.4 oz (84.1 kg)    BMI 31.82 kg/m  Assessment & Plan:  Katelyn Waller comes in today with chief complaint of Medical Management of Chronic Issues   Diagnosis and orders addressed:  1. Attention deficit hyperactivity disorder (ADHD), unspecified ADHD type Meds as prescribed Behavior modification as needed Follow-up for recheck in 3 months - lisdexamfetamine (VYVANSE) 50 MG capsule; Take 1 capsule (50 mg total) by mouth daily before breakfast.  Dispense: 30 capsule; Refill: 0 - lisdexamfetamine (VYVANSE) 50 MG capsule; Take 1 capsule (50 mg total) by mouth daily before breakfast.  Dispense: 30 capsule; Refill: 0 - lisdexamfetamine (VYVANSE) 50 MG capsule; Take 1 capsule (50 mg total) by mouth daily before breakfast.  Dispense: 30 capsule; Refill: 0 - ToxASSURE Select 13 (MW), Urine - CMP14+EGFR - CBC with Differential/Platelet  2. Controlled substance agreement signed - lisdexamfetamine (VYVANSE) 50 MG capsule; Take 1 capsule (50 mg total) by mouth daily before breakfast.  Dispense: 30 capsule; Refill: 0 - lisdexamfetamine (VYVANSE) 50 MG capsule; Take 1 capsule (50 mg total) by mouth daily  before breakfast.  Dispense: 30 capsule; Refill: 0 - lisdexamfetamine (VYVANSE) 50 MG capsule; Take 1 capsule (50 mg total) by mouth daily before breakfast.  Dispense: 30 capsule; Refill: 0 - ToxASSURE Select 13 (MW), Urine - CMP14+EGFR - CBC with Differential/Platelet  3. Annual physical exam - CMP14+EGFR - CBC with Differential/Platelet - Lipid panel - TSH - Hepatitis C antibody  4. Need for hepatitis C screening test - CMP14+EGFR - CBC with Differential/Platelet - Hepatitis C antibody   Labs pending Patient reviewed in [place] controlled database, no flags noted. Contract and drug screen are up to date.  Health Maintenance reviewed Diet and exercise encouraged  Follow up plan: 3 months    Evelina Dun, FNP

## 2021-09-18 NOTE — Patient Instructions (Signed)

## 2021-09-19 LAB — HEPATITIS C ANTIBODY: Hep C Virus Ab: <0.1 s/co ratio (ref 0.0–0.9)

## 2021-09-19 LAB — CBC WITH DIFFERENTIAL/PLATELET
Basophils Absolute: 0 10*3/uL (ref 0.0–0.2)
Basos: 1 %
EOS (ABSOLUTE): 0.1 10*3/uL (ref 0.0–0.4)
Eos: 1 %
Hematocrit: 41.4 % (ref 34.0–46.6)
Hemoglobin: 13.9 g/dL (ref 11.1–15.9)
Immature Grans (Abs): 0 10*3/uL (ref 0.0–0.1)
Immature Granulocytes: 0 %
Lymphocytes Absolute: 1.9 10*3/uL (ref 0.7–3.1)
Lymphs: 25 %
MCH: 31.4 pg (ref 26.6–33.0)
MCHC: 33.6 g/dL (ref 31.5–35.7)
MCV: 94 fL (ref 79–97)
Monocytes Absolute: 0.6 10*3/uL (ref 0.1–0.9)
Monocytes: 8 %
Neutrophils Absolute: 5 10*3/uL (ref 1.4–7.0)
Neutrophils: 65 %
Platelets: 304 10*3/uL (ref 150–450)
RBC: 4.42 x10E6/uL (ref 3.77–5.28)
RDW: 11.8 % (ref 11.7–15.4)
WBC: 7.6 10*3/uL (ref 3.4–10.8)

## 2021-09-19 LAB — CMP14+EGFR
ALT: 27 IU/L (ref 0–32)
AST: 19 IU/L (ref 0–40)
Albumin/Globulin Ratio: 1.9 (ref 1.2–2.2)
Albumin: 4.6 g/dL (ref 3.8–4.8)
Alkaline Phosphatase: 47 IU/L (ref 44–121)
BUN/Creatinine Ratio: 20 (ref 9–23)
BUN: 16 mg/dL (ref 6–20)
Bilirubin Total: 0.3 mg/dL (ref 0.0–1.2)
CO2: 26 mmol/L (ref 20–29)
Calcium: 9.5 mg/dL (ref 8.7–10.2)
Chloride: 105 mmol/L (ref 96–106)
Creatinine, Ser: 0.81 mg/dL (ref 0.57–1.00)
Globulin, Total: 2.4 g/dL (ref 1.5–4.5)
Glucose: 99 mg/dL (ref 70–99)
Potassium: 4.3 mmol/L (ref 3.5–5.2)
Sodium: 141 mmol/L (ref 134–144)
Total Protein: 7 g/dL (ref 6.0–8.5)
eGFR: 96 mL/min/{1.73_m2} (ref >59)

## 2021-09-19 LAB — LIPID PANEL
Chol/HDL Ratio: 2.4 ratio (ref 0.0–4.4)
Cholesterol, Total: 123 mg/dL (ref 100–199)
HDL: 51 mg/dL (ref >39)
LDL Chol Calc (NIH): 58 mg/dL (ref 0–99)
Triglycerides: 68 mg/dL (ref 0–149)
VLDL Cholesterol Cal: 14 mg/dL (ref 5–40)

## 2021-09-19 LAB — TSH: TSH: 1.65 u[IU]/mL (ref 0.450–4.500)

## 2021-09-22 ENCOUNTER — Telehealth: Payer: Self-pay | Admitting: *Deleted

## 2021-09-22 DIAGNOSIS — F909 Attention-deficit hyperactivity disorder, unspecified type: Secondary | ICD-10-CM

## 2021-09-22 DIAGNOSIS — Z6831 Body mass index (BMI) 31.0-31.9, adult: Secondary | ICD-10-CM

## 2021-09-22 NOTE — Telephone Encounter (Signed)
Key: [id number] help? Call us at [phone] Drug Vyvanse 50MG  capsules Sent to Plan today

## 2021-09-23 ENCOUNTER — Other Ambulatory Visit: Payer: Self-pay | Admitting: Family

## 2021-09-23 DIAGNOSIS — Z6831 Body mass index (BMI) 31.0-31.9, adult: Secondary | ICD-10-CM

## 2021-09-23 NOTE — Telephone Encounter (Signed)
Katelyn Waller (Key: [id number]) Wegovy 0.25MG /0.5ML auto-injectors   Form [employer] Electronic PA Form (2017 NCPDP)  Wait for Questions [employer] NCPDP 2017 typically responds with questions in less than 15 minutes, but may take up to 24 hours.

## 2021-09-24 ENCOUNTER — Telehealth: Payer: Self-pay | Admitting: Pharmacist

## 2021-09-24 DIAGNOSIS — Z6831 Body mass index (BMI) 31.0-31.9, adult: Secondary | ICD-10-CM | POA: Insufficient documentation

## 2021-09-24 LAB — TOXASSURE SELECT 13 (MW), URINE

## 2021-09-24 NOTE — Telephone Encounter (Signed)
Re: Katelyn Waller DOB: 08-Jan-1986 Notice of adverse decision [facility] [employer], the utilization review entity for [facility] [place] [facility], received a request for coverage of VYVANSE 50MG  CAP for you. Weve denied the request for the following reason(s): Coverage for this medication is denied for the following reason(s). We reviewed the information we received about your condition and circumstances. We used the policy when making this decision. The policy states that this medication may be approved when: - The member has a clinical condition or needs a specific dosage form for which there is no alternative on the formulary OR - The listed formulary alternatives are not recommended based on published guidelines or clinical literature OR - The formulary alternatives will likely be ineffective or less effective for the member OR - The formulary alternatives will likely cause an adverse effect OR - The member is unable to take the required number of formulary alternatives for the given diagnosis due to a trial and inadequate treatment response or contraindication OR - The member has tried and failed the required number of formulary alternatives. Based on the policy and the information we have, your request is denied. We did not receive any documentation that you meet any of the criteria outlined above. Formulary alternative(s) are amphetamine-dextroamphetamine, amphetamine-dextroamphetamine extended-release, atomoxetine, dexmethylphenidate, dexmethylphenidate extended-release capsule, methylphenidate extended-release capsule, dextroamphetamine, dextroamphetamine extendedrelease, guanfacine extended-release, methylphenidate, methylphenidate extended-release osmotic tablet (generic Concerta), methylphenidate extended-release capsule. Requirement: 3 in a class with 3 or more alternatives, 2 in a class with 2 alternatives, or 1 in a class with only 1 alternative. Please refer to

## 2021-09-25 ENCOUNTER — Telehealth: Payer: Self-pay | Admitting: Family Medicine

## 2021-09-25 MED ORDER — METHYLPHENIDATE HCL ER (OSM) 36 MG PO TBCR: 36.0000 mg | EXTENDED_RELEASE_TABLET | Freq: Every day | ORAL | 0 refills | Status: DC

## 2021-09-25 MED ORDER — LISDEXAMFETAMINE DIMESYLATE 50 MG PO CAPS: 50.0000 mg | ORAL_CAPSULE | Freq: Every day | ORAL | 0 refills | Status: DC

## 2021-09-25 NOTE — Telephone Encounter (Signed)
Prescription sent to pharmacy.

## 2021-09-25 NOTE — Telephone Encounter (Signed)
Pt in process from [place]

## 2021-09-25 NOTE — Telephone Encounter (Signed)
LEFT DETAILED MESSAGE PER DPR

## 2021-09-25 NOTE — Telephone Encounter (Signed)
Vyvanase denied by insurance. Must try Concerta or Adderall. I have sent in Concerta.

## 2021-09-28 NOTE — Telephone Encounter (Signed)
Your prior authorization for Reginal Lutes has been denied. RETURN TO DASHBOARD When applicable, information about how to complete an appeal for this patient will be sent to you. Please also see the determination letter provided by the payer/PBM for more information.  Message from plan: Your PA request has been denied. Additional information will be provided in the denial communication.  *Drug Not Covered/Plan Exclusion - Your request for coverage was denied because your prescription benefit plan does not cover the requested medication.

## 2021-09-29 NOTE — Telephone Encounter (Signed)
ok 

## 2021-12-21 ENCOUNTER — Ambulatory Visit: Payer: 59 | Admitting: Family

## 2022-01-05 ENCOUNTER — Encounter: Payer: Self-pay | Admitting: Family

## 2022-01-05 ENCOUNTER — Ambulatory Visit (INDEPENDENT_AMBULATORY_CARE_PROVIDER_SITE_OTHER): Payer: 59 | Admitting: Family

## 2022-01-05 VITALS — BP 131/89 | HR 75 | Ht 64.0 in | Wt 187.0 lb

## 2022-01-05 DIAGNOSIS — F909 Attention-deficit hyperactivity disorder, unspecified type: Secondary | ICD-10-CM | POA: Diagnosis not present

## 2022-01-05 DIAGNOSIS — R69 Illness, unspecified: Secondary | ICD-10-CM | POA: Diagnosis not present

## 2022-01-05 DIAGNOSIS — Z79899 Other long term (current) drug therapy: Secondary | ICD-10-CM

## 2022-01-05 DIAGNOSIS — Z6831 Body mass index (BMI) 31.0-31.9, adult: Secondary | ICD-10-CM | POA: Diagnosis not present

## 2022-01-05 MED ORDER — LISDEXAMFETAMINE DIMESYLATE 50 MG PO CAPS: 50.0000 mg | ORAL_CAPSULE | Freq: Every day | ORAL | 0 refills | Status: DC

## 2022-01-05 NOTE — Patient Instructions (Signed)
Attention Deficit Hyperactivity Disorder, Adult Attention deficit hyperactivity disorder (ADHD) is a mental health disorder that starts during childhood (neurodevelopmental disorder). For many people with ADHD, the disorder continues into the adult years. Treatment can help you manage your symptoms. What are the causes? The exact cause of ADHD is not known. Most experts believe genetics and environmental factors contribute to ADHD. What increases the risk? The following factors may make you more likely to develop this condition: Having a family history of ADHD. Being female. Being born to a mother who smoked or drank alcohol during pregnancy. Being exposed to lead or other toxins in the womb or early in life. Being born before 37 weeks of pregnancy (prematurely) or at a low birth weight. Having experienced a brain injury. What are the signs or symptoms? Symptoms of this condition depend on the type of ADHD. The two main types are inattentive and hyperactive-impulsive. Some people may have symptoms of both types. Symptoms of the inattentive type include: Difficulty paying attention. Making careless mistakes. Not following instructions. Being disorganized. Avoiding tasks that require time and attention. Losing and forgetting things. Being easily distracted. Symptoms of the hyperactive-impulsive type include: Restlessness. Talking too much. Interrupting. Difficulty with: Sitting still. Feeling motivated. Relaxing. Waiting in line or waiting for a turn. In adults, this condition may lead to certain problems, such as: Keeping jobs. Performing tasks at work. Having stable relationships. Being on time or keeping to a schedule. How is this diagnosed? This condition is diagnosed based on your current symptoms and your history of symptoms. The diagnosis can be made by a health care provider such as a primary care provider or a mental health care specialist. Your health care provider may use  a symptom checklist or a behavior rating scale to evaluate your symptoms. He or she may also want to talk with people who have observed your behaviors throughout your life. How is this treated? This condition can be treated with medicines and behavior therapy. Medicines may be the best option to reduce impulsive behaviors and improve attention. Your health care provider may recommend: Stimulant medicines. These are the most common medicines used for adult ADHD. They affect certain chemicals in the brain (neurotransmitters) and improve your ability to control your symptoms. A non-stimulant medicine for adult ADHD (atomoxetine). This medicine increases a neurotransmitter called norepinephrine. It may take weeks to months to see effects from this medicine. Counseling and behavioral management are also important for treating ADHD. Counseling is often used along with medicine. Your health care provider may suggest: Cognitive behavioral therapy (CBT). This type of therapy teaches you to replace negative thoughts and actions with positive thoughts and actions. When used as part of ADHD treatment, this therapy may also include: Coping strategies for organization, time management, impulse control, and stress reduction. Mindfulness and meditation training. Behavioral management. You may work with a coach who is specially trained to help people with ADHD manage and organize activities and function more effectively. Follow these instructions at home: Medicines  Take over-the-counter and prescription medicines only as told by your health care provider. Talk with your health care provider about the possible side effects of your medicines and how to manage them. Lifestyle  Do not use drugs. Do not drink alcohol if: Your health care provider tells you not to drink. You are pregnant, may be pregnant, or are planning to become pregnant. If you drink alcohol: Limit how much you use to: 0-1 drink a day for  women. 0-2 drinks a day   for men. Be aware of how much alcohol is in your drink. In the [place] one drink equals one 12 oz bottle of beer (355 mL), one 5 oz glass of wine (148 mL), or one 1 oz glass of hard liquor (44 mL). Get enough sleep. Eat a healthy diet. Exercise regularly. Exercise can help to reduce stress and anxiety. General instructions Learn as much as you can about adult ADHD, and work closely with your health care providers to find the treatments that work best for you. Follow the same schedule each day. Use reminder devices like notes, calendars, and phone apps to stay on time and organized. Keep all follow-up visits as told by your health care provider and therapist. This is important. Where to find more information A health care provider may be able to recommend resources that are available online or over the phone. You could start with: Attention Deficit Disorder Association (ADDA): www.add.org National Institute of Mental Health (NIMH): www.nimh.nih.gov Contact a health care provider if: Your symptoms continue to cause problems. You have side effects from your medicine, such as: Repeated muscle twitches, coughing, or speech outbursts. Sleep problems. Loss of appetite. Dizziness. Unusually fast heartbeat. Stomach pains. Headaches. You are struggling with anxiety, depression, or substance abuse. Get help right away if you: Have a severe reaction to a medicine. If you ever feel like you may hurt yourself or others, or have thoughts about taking your own life, get help right away. You can go to the nearest emergency department or call: Your local emergency services (911 in the [place] A suicide crisis helpline, such as the National Suicide Prevention Lifeline at 1-800-273-8255 or 988 in the [place] This is open 24 hours a day. Summary ADHD is a mental health disorder that starts during childhood (neurodevelopmental disorder) and often continues into the adult years. The  exact cause of ADHD is not known. Most experts believe genetics and environmental factors contribute to ADHD. There is no cure for ADHD, but treatment with medicine, cognitive behavioral therapy, or behavioral management can help you manage your condition. This information is not intended to replace advice given to you by your health care provider. Make sure you discuss any questions you have with your health care provider. Document Revised: 03/11/2021 Document Reviewed: 01/08/2019 Elsevier Patient Education  2023 Elsevier Inc.  

## 2022-01-05 NOTE — Progress Notes (Signed)
? ?Subjective:  ? ? Patient ID: Katelyn Waller, female    DOB: [birth year], 36 y.o.   MRN: [id number] ? ?Chief Complaint  ?Patient presents with  ? Medical Management of Chronic Issues  ? ADHD  ? ? ?HPI ?PT presents to the office for chronic follow up and refill of ADHD medication. PT currently taking Vyvanse 40 mg everyday. States this is working well and helping her stay on task and focused at work. Katelyn Waller changed her insurance company and they are not covering Vyvanase. Pt is paying cash for it.  ?  ? ? ?Review of Systems  ?All other systems reviewed and are negative. ? ?   ?Objective:  ? Physical Exam ?Vitals reviewed.  ?Constitutional:   ?   General: Katelyn Waller is not in acute distress. ?   Appearance: Katelyn Waller is well-developed. Katelyn Waller is obese.  ?HENT:  ?   Head: Normocephalic and atraumatic.  ?   Right Ear: Tympanic membrane normal.  ?   Left Ear: Tympanic membrane normal.  ?Eyes:  ?   Pupils: Pupils are equal, round, and reactive to light.  ?Neck:  ?   Thyroid: No thyromegaly.  ?Cardiovascular:  ?   Rate and Rhythm: Normal rate and regular rhythm.  ?   Heart sounds: Normal heart sounds. No murmur heard. ?Pulmonary:  ?   Effort: Pulmonary effort is normal. No respiratory distress.  ?   Breath sounds: Normal breath sounds. No wheezing.  ?Abdominal:  ?   General: Bowel sounds are normal. There is no distension.  ?   Palpations: Abdomen is soft.  ?   Tenderness: There is no abdominal tenderness.  ?Musculoskeletal:     ?   General: No tenderness. Normal range of motion.  ?   Cervical back: Normal range of motion and neck supple.  ?Skin: ?   General: Skin is warm and dry.  ?Neurological:  ?   Mental Status: Katelyn Waller is alert and oriented to person, place, and time.  ?   Cranial Nerves: No cranial nerve deficit.  ?   Deep Tendon Reflexes: Reflexes are normal and symmetric.  ?Psychiatric:     ?   Behavior: Behavior normal.     ?   Thought Content: Thought content normal.     ?   Judgment: Judgment normal.  ? ? ? ? ?BP 131/89   Pulse 75    Ht 5\' 4"  (1.626 m)   Wt 187 lb (84.8 kg)   SpO2 98%   BMI 32.10 kg/m?  ? ?   ?Assessment & Plan:  ?Katelyn Waller comes in today with chief complaint of Medical Management of Chronic Issues and ADHD ? ? ?Diagnosis and orders addressed: ? ?1. Attention deficit hyperactivity disorder (ADHD), unspecified ADHD type ?Meds as prescribed ?Behavior modification as needed ?Follow-up for recheck in 3 months ?- lisdexamfetamine (VYVANSE) 50 MG capsule; Take 1 capsule (50 mg total) by mouth daily before breakfast.  Dispense: 30 capsule; Refill: 0 ?- lisdexamfetamine (VYVANSE) 50 MG capsule; Take 1 capsule (50 mg total) by mouth daily before breakfast.  Dispense: 30 capsule; Refill: 0 ?- lisdexamfetamine (VYVANSE) 50 MG capsule; Take 1 capsule (50 mg total) by mouth daily before breakfast.  Dispense: 30 capsule; Refill: 0 ? ?2. BMI 31.0-31.9,adult ?- lisdexamfetamine (VYVANSE) 50 MG capsule; Take 1 capsule (50 mg total) by mouth daily before breakfast.  Dispense: 30 capsule; Refill: 0 ?- lisdexamfetamine (VYVANSE) 50 MG capsule; Take 1 capsule (50 mg total) by mouth daily before breakfast.  Dispense: 30 capsule; Refill: 0 ?- lisdexamfetamine (VYVANSE) 50 MG capsule; Take 1 capsule (50 mg total) by mouth daily before breakfast.  Dispense: 30 capsule; Refill: 0 ? ?3. Controlled substance agreement signed ?- lisdexamfetamine (VYVANSE) 50 MG capsule; Take 1 capsule (50 mg total) by mouth daily before breakfast.  Dispense: 30 capsule; Refill: 0 ?- lisdexamfetamine (VYVANSE) 50 MG capsule; Take 1 capsule (50 mg total) by mouth daily before breakfast.  Dispense: 30 capsule; Refill: 0 ?- lisdexamfetamine (VYVANSE) 50 MG capsule; Take 1 capsule (50 mg total) by mouth daily before breakfast.  Dispense: 30 capsule; Refill: 0 ? ? ? ?Health Maintenance reviewed ?Diet and exercise encouraged ? ?Follow up plan: ?3 months  ? ? ?Jannifer Rodney, FNP ? ? ? ?

## 2022-04-08 ENCOUNTER — Encounter: Payer: Self-pay | Admitting: Family

## 2022-04-08 ENCOUNTER — Ambulatory Visit (INDEPENDENT_AMBULATORY_CARE_PROVIDER_SITE_OTHER): Payer: 59 | Admitting: Family

## 2022-04-08 DIAGNOSIS — Z79899 Other long term (current) drug therapy: Secondary | ICD-10-CM | POA: Diagnosis not present

## 2022-04-08 DIAGNOSIS — R69 Illness, unspecified: Secondary | ICD-10-CM | POA: Diagnosis not present

## 2022-04-08 DIAGNOSIS — E663 Overweight: Secondary | ICD-10-CM

## 2022-04-08 DIAGNOSIS — F909 Attention-deficit hyperactivity disorder, unspecified type: Secondary | ICD-10-CM

## 2022-04-08 MED ORDER — LISDEXAMFETAMINE DIMESYLATE 50 MG PO CAPS: 50.0000 mg | ORAL_CAPSULE | Freq: Every day | ORAL | 0 refills | Status: DC

## 2022-04-08 NOTE — Patient Instructions (Signed)
Attention Deficit Hyperactivity Disorder, Adult Attention deficit hyperactivity disorder (ADHD) is a mental health disorder that starts during childhood. For many people with ADHD, the disorder continues into the adult years. Treatment can help you manage your symptoms. There are three main types of ADHD: Inattentive. With this type, adults have difficulty paying attention. This may affect cognitive abilities. Hyperactive-impulsive. With this type, adults have a lot of energy and have difficulty controlling their behavior. Combination type. Some people may have symptoms of both types. What are the causes? The exact cause of ADHD is not known. Most experts believe a person's genes and environment possibly contribute to ADHD. What increases the risk? The following factors may make you more likely to develop this condition: Having a first-degree relative such as a parent, brother, or sister, with the condition. Being born before 37 weeks of pregnancy (prematurely) or at a low birth weight. Being born to a mother who smoked tobacco or drank alcohol during pregnancy. Having experienced a brain injury. Being exposed to lead or other toxins in the womb or early in life. What are the signs or symptoms? Symptoms of this condition depend on the type of ADHD. Symptoms of the inattentive type include: Difficulty paying attention or following instructions. Often making simple mistakes. Being disorganized. Avoiding tasks that require time and attention. Losing and forgetting things. Symptoms of the hyperactive-impulsive type include: Restlessness. Talking out of turn, interrupting others, or talking too much. Difficulty with: Sitting still. Feeling motivated. Relaxing. Waiting in line or waiting for a turn. People with the combination type have symptoms of both of the other types. In adults, this condition may lead to certain problems, such as: Keeping jobs. Performing tasks at work. Having  stable relationships. Being on time or keeping to a schedule. How is this diagnosed? This condition is diagnosed based on your current symptoms and your history of symptoms. The diagnosis can be made by a health care provider such as a primary care provider or a mental health care specialist. Your health care provider may use a symptom checklist or a behavior rating scale to evaluate your symptoms. Your health care provider may also want to talk with people who have observed your behaviors throughout your life. How is this treated? This condition can be treated with medicines and behavior therapy. Medicines may be the best option to reduce impulsive behaviors and improve attention. Your health care provider may recommend: Stimulant medicines. These are the most common medicines used for adult ADHD. They affect certain chemicals in the brain (neurotransmitters) and improve your ability to control your symptoms. A non-stimulant medicine. These medicines can also improve focus, attention, and impulsive behavior. It may take weeks to months to see the effects of this medicine. Counseling and behavioral management are also important for treating ADHD. Counseling is often used along with medicine. Your health care provider may suggest: Cognitive behavioral therapy (CBT). This type of therapy teaches you to replace negative thoughts and actions with positive thoughts and actions. When used as part of ADHD treatment, this therapy may also include: Coping strategies for organization, time management, impulse control, and stress reduction. Mindfulness and meditation training. Behavioral management. You may work with a coach who is specially trained to help people with ADHD manage and organize activities and function more effectively. Follow these instructions at home: Medicines  Take over-the-counter and prescription medicines only as told by your health care provider. Talk with your health care provider  about the possible side effects of your medicines and   how to manage them. Alcohol use Do not drink alcohol if: Your health care provider tells you not to drink. You are pregnant, may be pregnant, or are planning to become pregnant. If you drink alcohol: Limit how much you use to: 0-1 drink a day for women. 0-2 drinks a day for men. Know how much alcohol is in your drink. In the [place] one drink equals one 12 oz bottle of beer (355 mL), one 5 oz glass of wine (148 mL), or one 1 oz glass of hard liquor (44 mL). Lifestyle  Do not use illegal drugs. Get enough sleep. Eat a healthy diet. Exercise regularly. Exercise can help to reduce stress and anxiety. General instructions Learn as much as you can about adult ADHD, and work closely with your health care providers to find the treatments that work best for you. Follow the same schedule each day. Use reminder devices like notes, calendars, and phone apps to stay on time and organized. Keep all follow-up visits. Your health care provider will need to monitor your condition and adjust your treatment over time. Where to find more information A health care provider may be able to recommend resources that are available online or over the phone. You could start with: Attention Deficit Disorder Association (ADDA): add.org National Institute of Mental Health (NIMH): nimh.nih.gov Contact a health care provider if: Your symptoms continue to cause problems. You have side effects from your medicine, such as: Repeated muscle twitches, coughing, or speech outbursts. Sleep problems. Loss of appetite. Dizziness. Unusually fast heartbeat. Stomach pains. Headaches. You are struggling with anxiety, depression, or substance abuse. Get help right away if: You have a severe reaction to a medicine. This symptom may be an emergency. Get help right away. Call 911. Do not wait to see if the symptom will go away. Do not drive yourself to the hospital. Take  one of these steps if you feel like you may hurt yourself or others, or have thoughts about taking your own life: Go to your nearest emergency room. Call 911. Call the National Suicide Prevention Lifeline at 1-800-273-8255 or 988. This is open 24 hours a day Text the Crisis Text Line at 741741. Summary ADHD is a mental health disorder that starts during childhood and often continues into your adult years. The exact cause of ADHD is not known. Most experts believe genetics and environmental factors contribute to ADHD. There is no cure for ADHD, but treatment with medicine, cognitive behavioral therapy, or behavioral management can help you manage your condition. This information is not intended to replace advice given to you by your health care provider. Make sure you discuss any questions you have with your health care provider. Document Revised: 12/04/2021 Document Reviewed: 12/04/2021 Elsevier Patient Education  2023 Elsevier Inc.  

## 2022-04-08 NOTE — Progress Notes (Signed)
Subjective:    Patient ID: Katelyn Waller, female    DOB: 11/09/1985, 36 y.o.   MRN: [id number]  No chief complaint on file.   HPI PT presents to the office for chronic follow up and refill of ADHD medication. PT currently taking Vyvanse 50 mg everyday. States this is working well and helping her stay on task and focused at work. She changed her insurance company and they are not covering Vyvanase. Pt is paying cash for it.      Review of Systems  All other systems reviewed and are negative.      Objective:   Physical Exam Vitals reviewed.  Constitutional:      General: She is not in acute distress.    Appearance: She is well-developed.  HENT:     Head: Normocephalic and atraumatic.     Right Ear: Tympanic membrane normal.     Left Ear: Tympanic membrane normal.  Eyes:     Pupils: Pupils are equal, round, and reactive to light.  Neck:     Thyroid: No thyromegaly.  Cardiovascular:     Rate and Rhythm: Normal rate and regular rhythm.     Heart sounds: Normal heart sounds. No murmur heard. Pulmonary:     Effort: Pulmonary effort is normal. No respiratory distress.     Breath sounds: Normal breath sounds. No wheezing.  Abdominal:     General: Bowel sounds are normal. There is no distension.     Palpations: Abdomen is soft.     Tenderness: There is no abdominal tenderness.  Musculoskeletal:        General: No tenderness. Normal range of motion.     Cervical back: Normal range of motion and neck supple.  Skin:    General: Skin is warm and dry.  Neurological:     Mental Status: She is alert and oriented to person, place, and time.     Cranial Nerves: No cranial nerve deficit.     Deep Tendon Reflexes: Reflexes are normal and symmetric.  Psychiatric:        Behavior: Behavior normal.        Thought Content: Thought content normal.        Judgment: Judgment normal.       BP (!) 123/90   Pulse 91   Temp 97.8 F (36.6 C) (Temporal)   Ht 5\' 4"  (1.626 m)   Wt  163 lb 3.2 oz (74 kg)   BMI 28.01 kg/m      Assessment & Plan:  TANEQUA KRETZ comes in today with chief complaint of No chief complaint on file.   Diagnosis and orders addressed:  1. Attention deficit hyperactivity disorder (ADHD), unspecified ADHD type Meds as prescribed Behavior modification as needed Follow-up for recheck in 3 months - lisdexamfetamine (VYVANSE) 50 MG capsule; Take 1 capsule (50 mg total) by mouth daily before breakfast.  Dispense: 30 capsule; Refill: 0 - lisdexamfetamine (VYVANSE) 50 MG capsule; Take 1 capsule (50 mg total) by mouth daily before breakfast.  Dispense: 30 capsule; Refill: 0 - lisdexamfetamine (VYVANSE) 50 MG capsule; Take 1 capsule (50 mg total) by mouth daily before breakfast.  Dispense: 30 capsule; Refill: 0  2. BMI 31.0-31.9,adult - lisdexamfetamine (VYVANSE) 50 MG capsule; Take 1 capsule (50 mg total) by mouth daily before breakfast.  Dispense: 30 capsule; Refill: 0 - lisdexamfetamine (VYVANSE) 50 MG capsule; Take 1 capsule (50 mg total) by mouth daily before breakfast.  Dispense: 30 capsule; Refill: 0 - lisdexamfetamine (VYVANSE)  50 MG capsule; Take 1 capsule (50 mg total) by mouth daily before breakfast.  Dispense: 30 capsule; Refill: 0  3. Controlled substance agreement signed - lisdexamfetamine (VYVANSE) 50 MG capsule; Take 1 capsule (50 mg total) by mouth daily before breakfast.  Dispense: 30 capsule; Refill: 0 - lisdexamfetamine (VYVANSE) 50 MG capsule; Take 1 capsule (50 mg total) by mouth daily before breakfast.  Dispense: 30 capsule; Refill: 0 - lisdexamfetamine (VYVANSE) 50 MG capsule; Take 1 capsule (50 mg total) by mouth daily before breakfast.  Dispense: 30 capsule; Refill: 0   Health Maintenance reviewed Diet and exercise encouraged  Follow up plan: 3 months    Jannifer Rodney, FNP

## 2022-04-13 ENCOUNTER — Other Ambulatory Visit: Payer: Self-pay | Admitting: Family

## 2022-04-13 DIAGNOSIS — Z79899 Other long term (current) drug therapy: Secondary | ICD-10-CM

## 2022-04-13 DIAGNOSIS — F909 Attention-deficit hyperactivity disorder, unspecified type: Secondary | ICD-10-CM

## 2022-04-13 DIAGNOSIS — E663 Overweight: Secondary | ICD-10-CM

## 2022-04-13 MED ORDER — LISDEXAMFETAMINE DIMESYLATE 50 MG PO CAPS: 50.0000 mg | ORAL_CAPSULE | Freq: Every day | ORAL | 0 refills | Status: DC

## 2022-07-09 ENCOUNTER — Encounter: Payer: Self-pay | Admitting: Family

## 2022-07-09 ENCOUNTER — Ambulatory Visit: Payer: 59 | Admitting: Family

## 2022-07-09 VITALS — BP 128/92 | HR 83 | Temp 98.7°F | Ht 64.0 in | Wt 149.8 lb

## 2022-07-09 DIAGNOSIS — Z79899 Other long term (current) drug therapy: Secondary | ICD-10-CM | POA: Diagnosis not present

## 2022-07-09 DIAGNOSIS — E663 Overweight: Secondary | ICD-10-CM | POA: Diagnosis not present

## 2022-07-09 DIAGNOSIS — F909 Attention-deficit hyperactivity disorder, unspecified type: Secondary | ICD-10-CM

## 2022-07-09 DIAGNOSIS — Z6831 Body mass index (BMI) 31.0-31.9, adult: Secondary | ICD-10-CM

## 2022-07-09 DIAGNOSIS — R69 Illness, unspecified: Secondary | ICD-10-CM | POA: Diagnosis not present

## 2022-07-09 MED ORDER — AMPHETAMINE-DEXTROAMPHET ER 25 MG PO CP24: 25.0000 mg | ORAL_CAPSULE | ORAL | 0 refills | Status: DC

## 2022-07-09 NOTE — Patient Instructions (Signed)
Attention Deficit Hyperactivity Disorder, Adult Attention deficit hyperactivity disorder (ADHD) is a mental health disorder that starts during childhood. For many people with ADHD, the disorder continues into the adult years. Treatment can help you manage your symptoms. There are three main types of ADHD: Inattentive. With this type, adults have difficulty paying attention. This may affect cognitive abilities. Hyperactive-impulsive. With this type, adults have a lot of energy and have difficulty controlling their behavior. Combination type. Some people may have symptoms of both types. What are the causes? The exact cause of ADHD is not known. Most experts believe a person's genes and environment possibly contribute to ADHD. What increases the risk? The following factors may make you more likely to develop this condition: Having a first-degree relative such as a parent, brother, or sister, with the condition. Being born before 37 weeks of pregnancy (prematurely) or at a low birth weight. Being born to a mother who smoked tobacco or drank alcohol during pregnancy. Having experienced a brain injury. Being exposed to lead or other toxins in the womb or early in life. What are the signs or symptoms? Symptoms of this condition depend on the type of ADHD. Symptoms of the inattentive type include: Difficulty paying attention or following instructions. Often making simple mistakes. Being disorganized. Avoiding tasks that require time and attention. Losing and forgetting things. Symptoms of the hyperactive-impulsive type include: Restlessness. Talking out of turn, interrupting others, or talking too much. Difficulty with: Sitting still. Feeling motivated. Relaxing. Waiting in line or waiting for a turn. People with the combination type have symptoms of both of the other types. In adults, this condition may lead to certain problems, such as: Keeping jobs. Performing tasks at work. Having  stable relationships. Being on time or keeping to a schedule. How is this diagnosed? This condition is diagnosed based on your current symptoms and your history of symptoms. The diagnosis can be made by a health care provider such as a primary care provider or a mental health care specialist. Your health care provider may use a symptom checklist or a behavior rating scale to evaluate your symptoms. Your health care provider may also want to talk with people who have observed your behaviors throughout your life. How is this treated? This condition can be treated with medicines and behavior therapy. Medicines may be the best option to reduce impulsive behaviors and improve attention. Your health care provider may recommend: Stimulant medicines. These are the most common medicines used for adult ADHD. They affect certain chemicals in the brain (neurotransmitters) and improve your ability to control your symptoms. A non-stimulant medicine. These medicines can also improve focus, attention, and impulsive behavior. It may take weeks to months to see the effects of this medicine. Counseling and behavioral management are also important for treating ADHD. Counseling is often used along with medicine. Your health care provider may suggest: Cognitive behavioral therapy (CBT). This type of therapy teaches you to replace negative thoughts and actions with positive thoughts and actions. When used as part of ADHD treatment, this therapy may also include: Coping strategies for organization, time management, impulse control, and stress reduction. Mindfulness and meditation training. Behavioral management. You may work with a coach who is specially trained to help people with ADHD manage and organize activities and function more effectively. Follow these instructions at home: Medicines  Take over-the-counter and prescription medicines only as told by your health care provider. Talk with your health care provider  about the possible side effects of your medicines and   how to manage them. Alcohol use Do not drink alcohol if: Your health care provider tells you not to drink. You are pregnant, may be pregnant, or are planning to become pregnant. If you drink alcohol: Limit how much you use to: 0-1 drink a day for women. 0-2 drinks a day for men. Know how much alcohol is in your drink. In the [place] one drink equals one 12 oz bottle of beer (355 mL), one 5 oz glass of wine (148 mL), or one 1 oz glass of hard liquor (44 mL). Lifestyle  Do not use illegal drugs. Get enough sleep. Eat a healthy diet. Exercise regularly. Exercise can help to reduce stress and anxiety. General instructions Learn as much as you can about adult ADHD, and work closely with your health care providers to find the treatments that work best for you. Follow the same schedule each day. Use reminder devices like notes, calendars, and phone apps to stay on time and organized. Keep all follow-up visits. Your health care provider will need to monitor your condition and adjust your treatment over time. Where to find more information A health care provider may be able to recommend resources that are available online or over the phone. You could start with: Attention Deficit Disorder Association (ADDA): add.org National Institute of Mental Health (NIMH): nimh.nih.gov Contact a health care provider if: Your symptoms continue to cause problems. You have side effects from your medicine, such as: Repeated muscle twitches, coughing, or speech outbursts. Sleep problems. Loss of appetite. Dizziness. Unusually fast heartbeat. Stomach pains. Headaches. You are struggling with anxiety, depression, or substance abuse. Get help right away if: You have a severe reaction to a medicine. This symptom may be an emergency. Get help right away. Call 911. Do not wait to see if the symptom will go away. Do not drive yourself to the hospital. Take  one of these steps if you feel like you may hurt yourself or others, or have thoughts about taking your own life: Go to your nearest emergency room. Call 911. Call the National Suicide Prevention Lifeline at 1-800-273-8255 or 988. This is open 24 hours a day Text the Crisis Text Line at 741741. Summary ADHD is a mental health disorder that starts during childhood and often continues into your adult years. The exact cause of ADHD is not known. Most experts believe genetics and environmental factors contribute to ADHD. There is no cure for ADHD, but treatment with medicine, cognitive behavioral therapy, or behavioral management can help you manage your condition. This information is not intended to replace advice given to you by your health care provider. Make sure you discuss any questions you have with your health care provider. Document Revised: 12/04/2021 Document Reviewed: 12/04/2021 Elsevier Patient Education  2023 Elsevier Inc.  

## 2022-07-09 NOTE — Progress Notes (Signed)
Subjective:    Patient ID: Katelyn Waller, female    DOB: 1986/07/26, 36 y.o.   MRN: [id number]  Chief Complaint  Patient presents with   Medical Management of Chronic Issues    HPI PT presents to the office for chronic follow up and refill of ADHD medication. PT currently taking Vyvanse 50 mg everyday. There is a back order and has not has her this month.   States this is working well and helping her stay on task and focused at work. She changed her insurance company and they are not covering Vyvanase. Pt is paying cash for it.     Review of Systems  All other systems reviewed and are negative.      Objective:   Physical Exam Vitals reviewed.  Constitutional:      General: She is not in acute distress.    Appearance: She is well-developed.  HENT:     Head: Normocephalic and atraumatic.     Right Ear: Tympanic membrane normal.     Left Ear: Tympanic membrane normal.  Eyes:     Pupils: Pupils are equal, round, and reactive to light.  Neck:     Thyroid: No thyromegaly.  Cardiovascular:     Rate and Rhythm: Normal rate and regular rhythm.     Heart sounds: Normal heart sounds. No murmur heard. Pulmonary:     Effort: Pulmonary effort is normal. No respiratory distress.     Breath sounds: Normal breath sounds. No wheezing.  Abdominal:     General: Bowel sounds are normal. There is no distension.     Palpations: Abdomen is soft.     Tenderness: There is no abdominal tenderness.  Musculoskeletal:        General: No tenderness. Normal range of motion.     Cervical back: Normal range of motion and neck supple.  Skin:    General: Skin is warm and dry.  Neurological:     Mental Status: She is alert and oriented to person, place, and time.     Cranial Nerves: No cranial nerve deficit.     Deep Tendon Reflexes: Reflexes are normal and symmetric.  Psychiatric:        Behavior: Behavior normal.        Thought Content: Thought content normal.        Judgment: Judgment  normal.       BP (!) 128/92   Pulse 83   Temp 98.7 F (37.1 C) (Temporal)   Ht 5\' 4"  (1.626 m)   Wt 149 lb 12.8 oz (67.9 kg)   BMI 25.71 kg/m      Assessment & Plan:  Katelyn Waller comes in today with chief complaint of Medical Management of Chronic Issues   Diagnosis and orders addressed:  1. Overweight (BMI 25.0-29.9)  2. Controlled substance agreement signed - amphetamine-dextroamphetamine (ADDERALL XR) 25 MG 24 hr capsule; Take 1 capsule by mouth every morning.  Dispense: 30 capsule; Refill: 0 - amphetamine-dextroamphetamine (ADDERALL XR) 25 MG 24 hr capsule; Take 1 capsule by mouth every morning.  Dispense: 30 capsule; Refill: 0 - amphetamine-dextroamphetamine (ADDERALL XR) 25 MG 24 hr capsule; Take 1 capsule by mouth every morning.  Dispense: 30 capsule; Refill: 0  3. Attention deficit hyperactivity disorder (ADHD), unspecified ADHD type Meds as prescribed Will stop Vyvanse because of national back order and start Adderall XR 25 Behavior modification as needed Follow-up for recheck in 3 months Patient reviewed in [place] controlled database, no flags noted. Contract  and drug screen are up to date.  - amphetamine-dextroamphetamine (ADDERALL XR) 25 MG 24 hr capsule; Take 1 capsule by mouth every morning.  Dispense: 30 capsule; Refill: 0 - amphetamine-dextroamphetamine (ADDERALL XR) 25 MG 24 hr capsule; Take 1 capsule by mouth every morning.  Dispense: 30 capsule; Refill: 0 - amphetamine-dextroamphetamine (ADDERALL XR) 25 MG 24 hr capsule; Take 1 capsule by mouth every morning.  Dispense: 30 capsule; Refill: 0  4. BMI 31.0-31.9,adult   Jannifer Rodney, FNP

## 2023-03-11 ENCOUNTER — Encounter: Payer: Self-pay | Admitting: Family

## 2023-03-11 ENCOUNTER — Ambulatory Visit: Payer: 59 | Admitting: Family

## 2023-03-11 VITALS — BP 127/89 | HR 71 | Temp 97.0°F | Ht 64.0 in | Wt 155.8 lb

## 2023-03-11 DIAGNOSIS — Z79899 Other long term (current) drug therapy: Secondary | ICD-10-CM

## 2023-03-11 DIAGNOSIS — Z0001 Encounter for general adult medical examination with abnormal findings: Secondary | ICD-10-CM | POA: Diagnosis not present

## 2023-03-11 DIAGNOSIS — Z0000 Encounter for general adult medical examination without abnormal findings: Secondary | ICD-10-CM

## 2023-03-11 DIAGNOSIS — F909 Attention-deficit hyperactivity disorder, unspecified type: Secondary | ICD-10-CM

## 2023-03-11 LAB — CMP14+EGFR
AST: 13 IU/L (ref 0–40)
Albumin: 4.4 g/dL (ref 3.9–4.9)
Alkaline Phosphatase: 42 IU/L — ABNORMAL LOW (ref 44–121)
Calcium: 9.4 mg/dL (ref 8.7–10.2)
Chloride: 105 mmol/L (ref 96–106)
Creatinine, Ser: 0.76 mg/dL (ref 0.57–1.00)
Glucose: 75 mg/dL (ref 70–99)
Sodium: 139 mmol/L (ref 134–144)

## 2023-03-11 LAB — CBC WITH DIFFERENTIAL/PLATELET
Basos: 0 %
Hematocrit: 39.5 % (ref 34.0–46.6)
Lymphocytes Absolute: 1.6 10*3/uL (ref 0.7–3.1)
MCV: 96 fL (ref 79–97)
Neutrophils Absolute: 5.6 10*3/uL (ref 1.4–7.0)
Neutrophils: 71 %
RBC: 4.12 x10E6/uL (ref 3.77–5.28)

## 2023-03-11 LAB — LIPID PANEL
Cholesterol, Total: 154 mg/dL (ref 100–199)
Triglycerides: 84 mg/dL (ref 0–149)

## 2023-03-11 MED ORDER — AMPHETAMINE-DEXTROAMPHET ER 25 MG PO CP24: 25.0000 mg | ORAL_CAPSULE | ORAL | 0 refills | Status: DC

## 2023-03-11 MED ORDER — AMPHETAMINE-DEXTROAMPHET ER 25 MG PO CP24
25.0000 mg | ORAL_CAPSULE | ORAL | 0 refills | Status: DC
Start: 2023-05-04 — End: 2023-06-13

## 2023-03-11 NOTE — Progress Notes (Signed)
Subjective:    Patient ID: Katelyn Waller, female    DOB: 02/03/1986, 37 y.o.   MRN: [id number]  Chief Complaint  Patient presents with   Medical Management of Chronic Issues    HPI PT presents to the office for CPE without pap chronic follow up and refill of ADHD medication. PT taking Adderall XR 25 mg daily.    States this is working well and helping her stay on task and focused at work. Denies any adverse effects.     Review of Systems  All other systems reviewed and are negative.  Family History  Problem Relation Age of Onset   Cancer Mother        thyroid   Cancer Sister    Social History   Socioeconomic History   Marital status: Married    Spouse name: Not on file   Number of children: Not on file   Years of education: Not on file   Highest education level: Associate degree: occupational, Scientist, product/process development, or vocational program  Occupational History   Not on file  Tobacco Use   Smoking status: Former   Smokeless tobacco: Never  Vaping Use   Vaping status: Never Used  Substance and Sexual Activity   Alcohol use: Yes   Drug use: No   Sexual activity: Not on file  Other Topics Concern   Not on file  Social History Narrative   Not on file   Social Determinants of Health   Financial Resource Strain: Low Risk  (03/09/2023)   Overall Financial Resource Strain (CARDIA)    Difficulty of Paying Living Expenses: Not hard at all  Food Insecurity: No Food Insecurity (03/09/2023)   Hunger Vital Sign    Worried About Running Out of Food in the Last Year: Never true    Ran Out of Food in the Last Year: Never true  Transportation Needs: No Transportation Needs (03/09/2023)   PRAPARE - Administrator, Civil Service (Medical): No    Lack of Transportation (Non-Medical): No  Physical Activity: Insufficiently Active (03/09/2023)   Exercise Vital Sign    Days of Exercise per Week: 3 days    Minutes of Exercise per Session: 40 min  Stress: No Stress Concern Present  (03/09/2023)   [employer] of Occupational Health - Occupational Stress Questionnaire    Feeling of Stress : Not at all  Social Connections: Moderately Integrated (03/09/2023)   Social Connection and Isolation Panel [NHANES]    Frequency of Communication with Friends and Family: More than three times a week    Frequency of Social Gatherings with Friends and Family: Twice a week    Attends Religious Services: 1 to 4 times per year    Active Member of [employer] or Organizations: No    Attends Engineer, structural: Not on file    Marital Status: Married       Objective:   Physical Exam Vitals reviewed.  Constitutional:      General: She is not in acute distress.    Appearance: She is well-developed.  HENT:     Head: Normocephalic and atraumatic.     Right Ear: Tympanic membrane normal.     Left Ear: Tympanic membrane normal.  Eyes:     Pupils: Pupils are equal, round, and reactive to light.  Neck:     Thyroid: No thyromegaly.  Cardiovascular:     Rate and Rhythm: Normal rate and regular rhythm.     Heart sounds: Normal heart sounds.  No murmur heard. Pulmonary:     Effort: Pulmonary effort is normal. No respiratory distress.     Breath sounds: Normal breath sounds. No wheezing.  Abdominal:     General: Bowel sounds are normal. There is no distension.     Palpations: Abdomen is soft.     Tenderness: There is no abdominal tenderness.  Musculoskeletal:        General: No tenderness. Normal range of motion.     Cervical back: Normal range of motion and neck supple.  Skin:    General: Skin is warm and dry.  Neurological:     Mental Status: She is alert and oriented to person, place, and time.     Cranial Nerves: No cranial nerve deficit.     Deep Tendon Reflexes: Reflexes are normal and symmetric.  Psychiatric:        Behavior: Behavior normal.        Thought Content: Thought content normal.        Judgment: Judgment normal.     BP 127/89   Pulse 71   Temp (!)  97 F (36.1 C) (Temporal)   Ht 5\' 4"  (1.626 m)   Wt 155 lb 12.8 oz (70.7 kg)   BMI 26.74 kg/m        Assessment & Plan:  Katelyn Waller comes in today with chief complaint of Medical Management of Chronic Issues   Diagnosis and orders addressed:  1. Controlled substance agreement signed - amphetamine-dextroamphetamine (ADDERALL XR) 25 MG 24 hr capsule; Take 1 capsule by mouth every morning.  Dispense: 30 capsule; Refill: 0 - amphetamine-dextroamphetamine (ADDERALL XR) 25 MG 24 hr capsule; Take 1 capsule by mouth every morning.  Dispense: 30 capsule; Refill: 0 - amphetamine-dextroamphetamine (ADDERALL XR) 25 MG 24 hr capsule; Take 1 capsule by mouth every morning.  Dispense: 30 capsule; Refill: 0 - ToxASSURE Select 13 (MW), Urine  2. Attention deficit hyperactivity disorder (ADHD), unspecified ADHD type - amphetamine-dextroamphetamine (ADDERALL XR) 25 MG 24 hr capsule; Take 1 capsule by mouth every morning.  Dispense: 30 capsule; Refill: 0 - amphetamine-dextroamphetamine (ADDERALL XR) 25 MG 24 hr capsule; Take 1 capsule by mouth every morning.  Dispense: 30 capsule; Refill: 0 - amphetamine-dextroamphetamine (ADDERALL XR) 25 MG 24 hr capsule; Take 1 capsule by mouth every morning.  Dispense: 30 capsule; Refill: 0 - ToxASSURE Select 13 (MW), Urine  3. Annual physical exam - CMP14+EGFR - CBC with Differential/Platelet - Lipid panel - TSH   Labs pending Patient reviewed in [place] controlled database, no flags noted. Contract and drug screen up dated today.  Health Maintenance reviewed Diet and exercise encouraged  Follow up plan: 3 months    Jannifer Rodney, FNP

## 2023-03-11 NOTE — Patient Instructions (Signed)

## 2023-03-12 LAB — CBC WITH DIFFERENTIAL/PLATELET
Basophils Absolute: 0 10*3/uL (ref 0.0–0.2)
EOS (ABSOLUTE): 0.1 10*3/uL (ref 0.0–0.4)
Eos: 2 %
Hemoglobin: 13 g/dL (ref 11.1–15.9)
Immature Grans (Abs): 0 10*3/uL (ref 0.0–0.1)
Immature Granulocytes: 0 %
Lymphs: 21 %
MCH: 31.6 pg (ref 26.6–33.0)
MCHC: 32.9 g/dL (ref 31.5–35.7)
Monocytes Absolute: 0.5 10*3/uL (ref 0.1–0.9)
Monocytes: 6 %
Platelets: 248 10*3/uL (ref 150–450)
RDW: 12 % (ref 11.7–15.4)
WBC: 7.9 10*3/uL (ref 3.4–10.8)

## 2023-03-12 LAB — TSH: TSH: 3.25 u[IU]/mL (ref 0.450–4.500)

## 2023-03-12 LAB — LIPID PANEL
Chol/HDL Ratio: 2.4 ratio (ref 0.0–4.4)
HDL: 65 mg/dL (ref >39)
LDL Chol Calc (NIH): 73 mg/dL (ref 0–99)
VLDL Cholesterol Cal: 16 mg/dL (ref 5–40)

## 2023-03-12 LAB — CMP14+EGFR
ALT: 15 IU/L (ref 0–32)
BUN/Creatinine Ratio: 17 (ref 9–23)
BUN: 13 mg/dL (ref 6–20)
Bilirubin Total: 0.3 mg/dL (ref 0.0–1.2)
CO2: 22 mmol/L (ref 20–29)
Globulin, Total: 2.6 g/dL (ref 1.5–4.5)
Potassium: 4.4 mmol/L (ref 3.5–5.2)
Total Protein: 7 g/dL (ref 6.0–8.5)
eGFR: 103 mL/min/{1.73_m2} (ref >59)

## 2023-03-16 LAB — TOXASSURE SELECT 13 (MW), URINE

## 2023-06-13 ENCOUNTER — Encounter: Payer: Self-pay | Admitting: Family

## 2023-06-13 ENCOUNTER — Ambulatory Visit: Payer: 59 | Admitting: Family

## 2023-06-13 VITALS — BP 126/84 | HR 0 | Temp 97.7°F | Wt 153.0 lb

## 2023-06-13 DIAGNOSIS — F909 Attention-deficit hyperactivity disorder, unspecified type: Secondary | ICD-10-CM

## 2023-06-13 DIAGNOSIS — Z79899 Other long term (current) drug therapy: Secondary | ICD-10-CM

## 2023-06-13 DIAGNOSIS — Z6831 Body mass index (BMI) 31.0-31.9, adult: Secondary | ICD-10-CM

## 2023-06-13 MED ORDER — AMPHETAMINE-DEXTROAMPHET ER 25 MG PO CP24
25.0000 mg | ORAL_CAPSULE | ORAL | 0 refills | Status: DC
Start: 2023-06-13 — End: 2023-06-13

## 2023-06-13 MED ORDER — AMPHETAMINE-DEXTROAMPHET ER 25 MG PO CP24
25.0000 mg | ORAL_CAPSULE | ORAL | 0 refills | Status: DC
Start: 2023-08-09 — End: 2023-06-13

## 2023-06-13 MED ORDER — AMPHETAMINE-DEXTROAMPHET ER 25 MG PO CP24
25.0000 mg | ORAL_CAPSULE | ORAL | 0 refills | Status: DC
Start: 2023-07-12 — End: 2023-06-13

## 2023-06-13 MED ORDER — AMPHETAMINE-DEXTROAMPHET ER 25 MG PO CP24
25.0000 mg | ORAL_CAPSULE | ORAL | 0 refills | Status: DC
Start: 2023-07-12 — End: 2023-09-15

## 2023-06-13 MED ORDER — AMPHETAMINE-DEXTROAMPHET ER 25 MG PO CP24
25.0000 mg | ORAL_CAPSULE | ORAL | 0 refills | Status: DC
Start: 2023-08-09 — End: 2023-09-15

## 2023-06-13 MED ORDER — AMPHETAMINE-DEXTROAMPHET ER 25 MG PO CP24
25.0000 mg | ORAL_CAPSULE | ORAL | 0 refills | Status: DC
Start: 2023-06-13 — End: 2023-09-15

## 2023-06-13 NOTE — Progress Notes (Signed)
Subjective:    Patient ID: Katelyn Waller, female    DOB: 10-Oct-1985, 37 y.o.   MRN: [id number]  Chief Complaint  Patient presents with   Medical Management of Chronic Issues    HPI PT presents to the office for CPE without pap chronic follow up and refill of ADHD medication. PT taking Adderall XR 25 mg daily.    States this is working well and helping her stay on task and focused at work. Denies any adverse effects.     Review of Systems  All other systems reviewed and are negative.      Objective:   Physical Exam Vitals reviewed.  Constitutional:      General: She is not in acute distress.    Appearance: She is well-developed.  HENT:     Head: Normocephalic and atraumatic.     Right Ear: Tympanic membrane normal.     Left Ear: Tympanic membrane normal.  Eyes:     Pupils: Pupils are equal, round, and reactive to light.  Neck:     Thyroid: No thyromegaly.  Cardiovascular:     Rate and Rhythm: Normal rate and regular rhythm.     Heart sounds: Normal heart sounds. No murmur heard. Pulmonary:     Effort: Pulmonary effort is normal. No respiratory distress.     Breath sounds: Normal breath sounds. No wheezing.  Abdominal:     General: Bowel sounds are normal. There is no distension.     Palpations: Abdomen is soft.     Tenderness: There is no abdominal tenderness.  Musculoskeletal:        General: No tenderness. Normal range of motion.     Cervical back: Normal range of motion and neck supple.  Skin:    General: Skin is warm and dry.  Neurological:     Mental Status: She is alert and oriented to person, place, and time.     Cranial Nerves: No cranial nerve deficit.     Deep Tendon Reflexes: Reflexes are normal and symmetric.  Psychiatric:        Behavior: Behavior normal.        Thought Content: Thought content normal.        Judgment: Judgment normal.       BP 126/84   Pulse (!) 0   Temp 97.7 F (36.5 C) (Temporal)   Wt 153 lb (69.4 kg)   BMI 26.26  kg/m      Assessment & Plan:  Katelyn Waller comes in today with chief complaint of Medical Management of Chronic Issues   Diagnosis and orders addressed:  1. Controlled substance agreement signed - amphetamine-dextroamphetamine (ADDERALL XR) 25 MG 24 hr capsule; Take 1 capsule by mouth every morning.  Dispense: 30 capsule; Refill: 0 - amphetamine-dextroamphetamine (ADDERALL XR) 25 MG 24 hr capsule; Take 1 capsule by mouth every morning.  Dispense: 30 capsule; Refill: 0 - amphetamine-dextroamphetamine (ADDERALL XR) 25 MG 24 hr capsule; Take 1 capsule by mouth every morning.  Dispense: 30 capsule; Refill: 0  2. Attention deficit hyperactivity disorder (ADHD), unspecified ADHD type Meds as prescribed Behavior modification as needed Follow-up for recheck in 3 months - amphetamine-dextroamphetamine (ADDERALL XR) 25 MG 24 hr capsule; Take 1 capsule by mouth every morning.  Dispense: 30 capsule; Refill: 0 - amphetamine-dextroamphetamine (ADDERALL XR) 25 MG 24 hr capsule; Take 1 capsule by mouth every morning.  Dispense: 30 capsule; Refill: 0 - amphetamine-dextroamphetamine (ADDERALL XR) 25 MG 24 hr capsule; Take 1 capsule by mouth  every morning.  Dispense: 30 capsule; Refill: 0  3. BMI 31.0-31.9,adult    Patient reviewed in [place] controlled database, no flags noted. Contract and drug screen are up to date.  Health Maintenance reviewed Diet and exercise encouraged  Follow up plan: 3 months   Jannifer Rodney, FNP

## 2023-06-13 NOTE — Patient Instructions (Signed)

## 2023-09-15 ENCOUNTER — Telehealth (INDEPENDENT_AMBULATORY_CARE_PROVIDER_SITE_OTHER): Payer: Self-pay | Admitting: Family

## 2023-09-15 ENCOUNTER — Encounter: Payer: Self-pay | Admitting: Family

## 2023-09-15 DIAGNOSIS — Z79899 Other long term (current) drug therapy: Secondary | ICD-10-CM

## 2023-09-15 DIAGNOSIS — F909 Attention-deficit hyperactivity disorder, unspecified type: Secondary | ICD-10-CM

## 2023-09-15 DIAGNOSIS — J069 Acute upper respiratory infection, unspecified: Secondary | ICD-10-CM

## 2023-09-15 MED ORDER — AMPHETAMINE-DEXTROAMPHETAMINE 20 MG PO TABS
20.0000 mg | ORAL_TABLET | Freq: Every day | ORAL | 0 refills | Status: DC
Start: 2023-11-14 — End: 2023-12-20

## 2023-09-15 MED ORDER — AMPHETAMINE-DEXTROAMPHETAMINE 20 MG PO TABS
20.0000 mg | ORAL_TABLET | Freq: Every day | ORAL | 0 refills | Status: DC
Start: 2023-09-15 — End: 2023-12-20

## 2023-09-15 MED ORDER — AMPHETAMINE-DEXTROAMPHETAMINE 20 MG PO TABS
20.0000 mg | ORAL_TABLET | Freq: Every day | ORAL | 0 refills | Status: DC
Start: 2023-10-15 — End: 2023-12-20

## 2023-09-15 NOTE — Progress Notes (Addendum)
Virtual Visit Consent   Katelyn Waller, you are scheduled for a virtual visit with a [facility] provider today. Just as with appointments in the office, your consent must be obtained to participate. Your consent will be active for this visit and any virtual visit you may have with one of our providers in the next 365 days. If you have a MyChart account, a copy of this consent can be sent to you electronically.  As this is a virtual visit, video technology does not allow for your provider to perform a traditional examination. This may limit your provider's ability to fully assess your condition. If your provider identifies any concerns that need to be evaluated in person or the need to arrange testing (such as labs, EKG, etc.), we will make arrangements to do so. Although advances in technology are sophisticated, we cannot ensure that it will always work on either your end or our end. If the connection with a video visit is poor, the visit may have to be switched to a telephone visit. With either a video or telephone visit, we are not always able to ensure that we have a secure connection.  By engaging in this virtual visit, you consent to the provision of healthcare and authorize for your insurance to be billed (if applicable) for the services provided during this visit. Depending on your insurance coverage, you may receive a charge related to this service.  I need to obtain your verbal consent now. Are you willing to proceed with your visit today? Katelyn Waller has provided verbal consent on 09/15/2023 for a virtual visit (video or telephone). Jannifer Rodney, FNP  Date: 09/15/2023 9:51 AM  Virtual Visit via Video Note   I, Jannifer Rodney, connected with  Katelyn Waller  ([id number], 40-10-1985) on 09/15/23 at  9:25 AM EST by a video-enabled telemedicine application and verified that I am speaking with the correct person using two identifiers.  Location: Patient: Virtual Visit Location Patient:  Home Provider: Virtual Visit Location Provider: Home Office   I discussed the limitations of evaluation and management by telemedicine and the availability of in person appointments. The patient expressed understanding and agreed to proceed.    History of Present Illness: Katelyn Waller is a 38 y.o. who identifies as a female who was assigned female at birth, and is being seen today for chronic follow up and refill of ADHD medication. PT taking Adderall XR 25 mg daily. She reports this is helping her staying focus, however, she has been having trouble sleeping. Would like to try the shorter acting vs XR.    States this is working well and helping her stay on task and focused at work. Denies any adverse effects.  Marland Kitchen  HPI: URI  This is a new problem. The current episode started in the past 7 days. The problem has been gradually improving. Associated symptoms include rhinorrhea and a sore throat.    Problems:  Patient Active Problem List   Diagnosis Date Noted   BMI 31.0-31.9,adult 09/24/2021   Controlled substance agreement signed 05/26/2018   ADHD (attention deficit hyperactivity disorder) 02/12/2015    Allergies:  Allergies  Allergen Reactions   Sulfa Antibiotics Rash and Hives   Amoxicillin Rash   Medications:  Current Outpatient Medications:    amphetamine-dextroamphetamine (ADDERALL) 20 MG tablet, Take 1 tablet (20 mg total) by mouth daily before breakfast., Disp: 30 tablet, Rfl: 0   [START ON 10/15/2023] amphetamine-dextroamphetamine (ADDERALL) 20 MG tablet, Take 1 tablet (20  mg total) by mouth daily before breakfast., Disp: 30 tablet, Rfl: 0   [START ON 11/14/2023] amphetamine-dextroamphetamine (ADDERALL) 20 MG tablet, Take 1 tablet (20 mg total) by mouth daily before breakfast., Disp: 30 tablet, Rfl: 0   SEMAGLUTIDE-WEIGHT MANAGEMENT [place], Inject 1 Dose into the skin once a week., Disp: , Rfl:   Observations/Objective: Patient is well-developed, well-nourished in no acute  distress.  Resting comfortably at home.  Head is normocephalic, atraumatic.  No labored breathing. Speech is clear and coherent with logical content.  Patient is alert and oriented at baseline.  Nasal congestion    Assessment and Plan: 1. Attention deficit hyperactivity disorder (ADHD), unspecified ADHD type (Primary) - amphetamine-dextroamphetamine (ADDERALL) 20 MG tablet; Take 1 tablet (20 mg total) by mouth daily before breakfast.  Dispense: 30 tablet; Refill: 0 - amphetamine-dextroamphetamine (ADDERALL) 20 MG tablet; Take 1 tablet (20 mg total) by mouth daily before breakfast.  Dispense: 30 tablet; Refill: 0 - amphetamine-dextroamphetamine (ADDERALL) 20 MG tablet; Take 1 tablet (20 mg total) by mouth daily before breakfast.  Dispense: 30 tablet; Refill: 0  2. Controlled substance agreement signed  3. Viral URI  Meds as prescribed Will change Adderall XR 25 mg to Adderall 20 mg Behavior modification as needed Follow-up for recheck in 3 months - Take meds as prescribed - Use a cool mist humidifier  -Use saline nose sprays frequently -Force fluids -For any cough or congestion  Use plain Mucinex- regular strength or max strength is fine -For fever or aces or pains- take tylenol or ibuprofen. -Throat lozenges if help -Follow up if symptoms worsen or do not improve   Follow Up Instructions: I discussed the assessment and treatment plan with the patient. The patient was provided an opportunity to ask questions and all were answered. The patient agreed with the plan and demonstrated an understanding of the instructions.  A copy of instructions were sent to the patient via MyChart unless otherwise noted below.     The patient was advised to call back or seek an in-person evaluation if the symptoms worsen or if the condition fails to improve as anticipated.    Jannifer Rodney, FNP

## 2023-12-20 ENCOUNTER — Telehealth (INDEPENDENT_AMBULATORY_CARE_PROVIDER_SITE_OTHER): Payer: Self-pay | Admitting: Family

## 2023-12-20 ENCOUNTER — Encounter: Payer: Self-pay | Admitting: Family

## 2023-12-20 DIAGNOSIS — Z79899 Other long term (current) drug therapy: Secondary | ICD-10-CM

## 2023-12-20 DIAGNOSIS — F909 Attention-deficit hyperactivity disorder, unspecified type: Secondary | ICD-10-CM

## 2023-12-20 MED ORDER — AMPHETAMINE-DEXTROAMPHETAMINE 20 MG PO TABS
20.0000 mg | ORAL_TABLET | Freq: Every day | ORAL | 0 refills | Status: DC
Start: 2023-12-20 — End: 2024-04-27

## 2023-12-20 MED ORDER — AMPHETAMINE-DEXTROAMPHETAMINE 20 MG PO TABS
20.0000 mg | ORAL_TABLET | Freq: Every day | ORAL | 0 refills | Status: DC
Start: 2024-02-16 — End: 2024-04-27

## 2023-12-20 MED ORDER — AMPHETAMINE-DEXTROAMPHETAMINE 20 MG PO TABS
20.0000 mg | ORAL_TABLET | Freq: Every day | ORAL | 0 refills | Status: DC
Start: 2024-01-16 — End: 2024-04-27

## 2023-12-20 NOTE — Progress Notes (Signed)
 Virtual Visit Consent   Katelyn Waller, you are scheduled for a virtual visit with a [facility] provider today. Just as with appointments in the office, your consent must be obtained to participate. Your consent will be active for this visit and any virtual visit you may have with one of our providers in the next 365 days. If you have a MyChart account, a copy of this consent can be sent to you electronically.  As this is a virtual visit, video technology does not allow for your provider to perform a traditional examination. This may limit your provider's ability to fully assess your condition. If your provider identifies any concerns that need to be evaluated in person or the need to arrange testing (such as labs, EKG, etc.), we will make arrangements to do so. Although advances in technology are sophisticated, we cannot ensure that it will always work on either your end or our end. If the connection with a video visit is poor, the visit may have to be switched to a telephone visit. With either a video or telephone visit, we are not always able to ensure that we have a secure connection.  By engaging in this virtual visit, you consent to the provision of healthcare and authorize for your insurance to be billed (if applicable) for the services provided during this visit. Depending on your insurance coverage, you may receive a charge related to this service.  I need to obtain your verbal consent now. Are you willing to proceed with your visit today? Katelyn Waller has provided verbal consent on 12/20/2023 for a virtual visit (video or telephone). Tommas Fragmin, FNP  Date: 12/20/2023 1:42 PM   Virtual Visit via Video Note   ITommas Fragmin, connected with  Katelyn Waller  ([id number], Feb 01, 1986) on 12/20/23 at  3:25 PM EDT by a video-enabled telemedicine application and verified that I am speaking with the correct person using two identifiers.  Location: Patient: Virtual Visit Location  Patient: Home Provider: Virtual Visit Location Provider: Home Office   I discussed the limitations of evaluation and management by telemedicine and the availability of in person appointments. The patient expressed understanding and agreed to proceed.    History of Present Illness: Katelyn Waller is a 38 y.o. who identifies as a female who was assigned female at birth, and is being seen today for refill of ADHD medication. PT taking Adderall 20 mg daily. She reports this is helping her staying focus.States she is sleeping much better since stopping XR.   States this is working well and helping her stay on task and focused at work. Denies any adverse effects.    HPI: HPI  Problems:  Patient Active Problem List   Diagnosis Date Noted   BMI 31.0-31.9,adult 09/24/2021   Controlled substance agreement signed 05/26/2018   ADHD (attention deficit hyperactivity disorder) 02/12/2015    Allergies:  Allergies  Allergen Reactions   Sulfa Antibiotics Rash and Hives   Amoxicillin  Rash   Medications:  Current Outpatient Medications:    amphetamine -dextroamphetamine  (ADDERALL) 20 MG tablet, Take 1 tablet (20 mg total) by mouth daily before breakfast., Disp: 30 tablet, Rfl: 0   [START ON 01/16/2024] amphetamine -dextroamphetamine  (ADDERALL) 20 MG tablet, Take 1 tablet (20 mg total) by mouth daily before breakfast., Disp: 30 tablet, Rfl: 0   [START ON 02/16/2024] amphetamine -dextroamphetamine  (ADDERALL) 20 MG tablet, Take 1 tablet (20 mg total) by mouth daily before breakfast., Disp: 30 tablet, Rfl: 0   SEMAGLUTIDE -WEIGHT MANAGEMENT [place], Inject  1 Dose into the skin once a week., Disp: , Rfl:   Observations/Objective: Patient is well-developed, well-nourished in no acute distress.  Resting comfortably  at home.  Head is normocephalic, atraumatic.  No labored breathing.  Speech is clear and coherent with logical content.  Patient is alert and oriented at baseline.    Assessment and Plan: 1.  Attention deficit hyperactivity disorder (ADHD), unspecified ADHD type (Primary) - amphetamine -dextroamphetamine  (ADDERALL) 20 MG tablet; Take 1 tablet (20 mg total) by mouth daily before breakfast.  Dispense: 30 tablet; Refill: 0 - amphetamine -dextroamphetamine  (ADDERALL) 20 MG tablet; Take 1 tablet (20 mg total) by mouth daily before breakfast.  Dispense: 30 tablet; Refill: 0 - amphetamine -dextroamphetamine  (ADDERALL) 20 MG tablet; Take 1 tablet (20 mg total) by mouth daily before breakfast.  Dispense: 30 tablet; Refill: 0  2. Controlled substance agreement signed - amphetamine -dextroamphetamine  (ADDERALL) 20 MG tablet; Take 1 tablet (20 mg total) by mouth daily before breakfast.  Dispense: 30 tablet; Refill: 0 - amphetamine -dextroamphetamine  (ADDERALL) 20 MG tablet; Take 1 tablet (20 mg total) by mouth daily before breakfast.  Dispense: 30 tablet; Refill: 0 - amphetamine -dextroamphetamine  (ADDERALL) 20 MG tablet; Take 1 tablet (20 mg total) by mouth daily before breakfast.  Dispense: 30 tablet; Refill: 0  Meds as prescribed Behavior modification as needed Follow-up for recheck in 3 months   Follow Up Instructions: I discussed the assessment and treatment plan with the patient. The patient was provided an opportunity to ask questions and all were answered. The patient agreed with the plan and demonstrated an understanding of the instructions.  A copy of instructions were sent to the patient via MyChart unless otherwise noted below.     The patient was advised to call back or seek an in-person evaluation if the symptoms worsen or if the condition fails to improve as anticipated.    Tommas Fragmin, FNP

## 2024-04-27 ENCOUNTER — Encounter: Payer: Self-pay | Admitting: Family

## 2024-04-27 ENCOUNTER — Ambulatory Visit (INDEPENDENT_AMBULATORY_CARE_PROVIDER_SITE_OTHER): Admitting: Family

## 2024-04-27 VITALS — BP 117/82 | HR 76 | Temp 97.8°F | Ht 64.0 in | Wt 156.0 lb

## 2024-04-27 DIAGNOSIS — Z0001 Encounter for general adult medical examination with abnormal findings: Secondary | ICD-10-CM

## 2024-04-27 DIAGNOSIS — Z0000 Encounter for general adult medical examination without abnormal findings: Secondary | ICD-10-CM

## 2024-04-27 DIAGNOSIS — Z79899 Other long term (current) drug therapy: Secondary | ICD-10-CM

## 2024-04-27 DIAGNOSIS — R5383 Other fatigue: Secondary | ICD-10-CM

## 2024-04-27 DIAGNOSIS — F909 Attention-deficit hyperactivity disorder, unspecified type: Secondary | ICD-10-CM

## 2024-04-27 MED ORDER — AMPHETAMINE-DEXTROAMPHETAMINE 20 MG PO TABS: 20.0000 mg | ORAL_TABLET | Freq: Every day | ORAL | 0 refills | Status: DC

## 2024-04-27 NOTE — Progress Notes (Signed)
 Subjective:    Patient ID: Katelyn Waller, female    DOB: Mar 22, 1986, 38 y.o.   MRN: [id number]  Chief Complaint  Patient presents with   Medical Management of Chronic Issues    HPI PT presents to the office for CPE without pap chronic follow up and refill of ADHD medication. PT taking Adderall XR 20 mg daily.    States this is working well and helping her stay on task and focused at work. Denies any adverse effects.    Pt denies any headache, palpitations, SOB, or edema at this time.    Review of Systems  All other systems reviewed and are negative.      Objective:   Physical Exam Vitals reviewed.  Constitutional:      General: She is not in acute distress.    Appearance: She is well-developed.  HENT:     Head: Normocephalic and atraumatic.     Right Ear: Tympanic membrane normal.     Left Ear: Tympanic membrane normal.  Eyes:     Pupils: Pupils are equal, round, and reactive to light.  Neck:     Thyroid: No thyromegaly.  Cardiovascular:     Rate and Rhythm: Normal rate and regular rhythm.     Heart sounds: Normal heart sounds. No murmur heard. Pulmonary:     Effort: Pulmonary effort is normal. No respiratory distress.     Breath sounds: Normal breath sounds. No wheezing.  Abdominal:     General: Bowel sounds are normal. There is no distension.     Palpations: Abdomen is soft.     Tenderness: There is no abdominal tenderness.  Musculoskeletal:        General: No tenderness. Normal range of motion.     Cervical back: Normal range of motion and neck supple.  Skin:    General: Skin is warm and dry.  Neurological:     Mental Status: She is alert and oriented to person, place, and time.     Cranial Nerves: No cranial nerve deficit.     Deep Tendon Reflexes: Reflexes are normal and symmetric.  Psychiatric:        Behavior: Behavior normal.        Thought Content: Thought content normal.        Judgment: Judgment normal.       BP 117/82   Pulse 76    Temp 97.8 F (36.6 C) (Temporal)   Ht 5' 4 (1.626 m)   Wt 156 lb (70.8 kg)   BMI 26.78 kg/m      Assessment & Plan:  Katelyn Waller comes in today with chief complaint of Medical Management of Chronic Issues   Diagnosis and orders addressed:  1. Attention deficit hyperactivity disorder (ADHD), unspecified ADHD type Meds as prescribed Behavior modification as needed Follow-up for recheck in 3 months - amphetamine -dextroamphetamine  (ADDERALL) 20 MG tablet; Take 1 tablet (20 mg total) by mouth daily before breakfast.  Dispense: 30 tablet; Refill: 0 - amphetamine -dextroamphetamine  (ADDERALL) 20 MG tablet; Take 1 tablet (20 mg total) by mouth daily before breakfast.  Dispense: 30 tablet; Refill: 0 - amphetamine -dextroamphetamine  (ADDERALL) 20 MG tablet; Take 1 tablet (20 mg total) by mouth daily before breakfast.  Dispense: 30 tablet; Refill: 0 - CMP14+EGFR - CBC with Differential/Platelet  2. Controlled substance agreement signed - amphetamine -dextroamphetamine  (ADDERALL) 20 MG tablet; Take 1 tablet (20 mg total) by mouth daily before breakfast.  Dispense: 30 tablet; Refill: 0 - amphetamine -dextroamphetamine  (ADDERALL) 20 MG tablet; Take  1 tablet (20 mg total) by mouth daily before breakfast.  Dispense: 30 tablet; Refill: 0 - amphetamine -dextroamphetamine  (ADDERALL) 20 MG tablet; Take 1 tablet (20 mg total) by mouth daily before breakfast.  Dispense: 30 tablet; Refill: 0 - CMP14+EGFR - CBC with Differential/Platelet  3. Annual physical exam (Primary) - ToxASSURE Select 13 (MW), Urine - CMP14+EGFR - CBC with Differential/Platelet - Lipid panel - TSH - Vitamin B12  4. Other fatigue - TSH - Vitamin B12     Patient reviewed in [place] controlled database, no flags noted. Contract and drug screen are up to date.  Continue current medications  Health Maintenance reviewed Diet and exercise encouraged  Follow up plan: 3 months   [name], FNP

## 2024-04-27 NOTE — Patient Instructions (Signed)

## 2024-04-28 LAB — CMP14+EGFR
ALT: 15 IU/L (ref 0–32)
AST: 16 IU/L (ref 0–40)
Albumin: 4.4 g/dL (ref 3.9–4.9)
Alkaline Phosphatase: 41 IU/L — ABNORMAL LOW (ref 44–121)
BUN/Creatinine Ratio: 14 (ref 9–23)
BUN: 13 mg/dL (ref 6–20)
Bilirubin Total: 0.5 mg/dL (ref 0.0–1.2)
CO2: 24 mmol/L (ref 20–29)
Calcium: 9.3 mg/dL (ref 8.7–10.2)
Chloride: 104 mmol/L (ref 96–106)
Creatinine, Ser: 0.93 mg/dL (ref 0.57–1.00)
Globulin, Total: 2.4 g/dL (ref 1.5–4.5)
Glucose: 84 mg/dL (ref 70–99)
Potassium: 4.5 mmol/L (ref 3.5–5.2)
Sodium: 138 mmol/L (ref 134–144)
Total Protein: 6.8 g/dL (ref 6.0–8.5)
eGFR: 80 mL/min/1.73 (ref >59)

## 2024-04-28 LAB — CBC WITH DIFFERENTIAL/PLATELET
Basophils Absolute: 0 x10E3/uL (ref 0.0–0.2)
Basos: 1 %
EOS (ABSOLUTE): 0.1 x10E3/uL (ref 0.0–0.4)
Eos: 1 %
Hematocrit: 37.4 % (ref 34.0–46.6)
Hemoglobin: 11.7 g/dL (ref 11.1–15.9)
Immature Grans (Abs): 0 x10E3/uL (ref 0.0–0.1)
Immature Granulocytes: 0 %
Lymphocytes Absolute: 1.7 x10E3/uL (ref 0.7–3.1)
Lymphs: 28 %
MCH: 28.9 pg (ref 26.6–33.0)
MCHC: 31.3 g/dL — ABNORMAL LOW (ref 31.5–35.7)
MCV: 92 fL (ref 79–97)
Monocytes Absolute: 0.5 x10E3/uL (ref 0.1–0.9)
Monocytes: 8 %
Neutrophils Absolute: 3.8 x10E3/uL (ref 1.4–7.0)
Neutrophils: 62 %
Platelets: 252 x10E3/uL (ref 150–450)
RBC: 4.05 x10E6/uL (ref 3.77–5.28)
RDW: 13.2 % (ref 11.7–15.4)
WBC: 6.1 x10E3/uL (ref 3.4–10.8)

## 2024-04-28 LAB — LIPID PANEL
Chol/HDL Ratio: 2.3 ratio (ref 0.0–4.4)
Cholesterol, Total: 141 mg/dL (ref 100–199)
HDL: 61 mg/dL (ref >39)
LDL Chol Calc (NIH): 67 mg/dL (ref 0–99)
Triglycerides: 60 mg/dL (ref 0–149)
VLDL Cholesterol Cal: 13 mg/dL (ref 5–40)

## 2024-04-28 LAB — VITAMIN B12: Vitamin B-12: 520 pg/mL (ref 232–1245)

## 2024-04-28 LAB — TSH: TSH: 2.38 u[IU]/mL (ref 0.450–4.500)

## 2024-05-01 ENCOUNTER — Ambulatory Visit: Payer: Self-pay | Admitting: Family

## 2024-05-01 ENCOUNTER — Other Ambulatory Visit: Payer: Self-pay | Admitting: Family

## 2024-05-01 LAB — TOXASSURE SELECT 13 (MW), URINE

## 2024-05-01 MED ORDER — SILVER SULFADIAZINE 1 % EX CREA: TOPICAL_CREAM | CUTANEOUS | 1 refills | Status: AC

## 2024-07-30 ENCOUNTER — Encounter: Payer: Self-pay | Admitting: Family

## 2024-07-30 ENCOUNTER — Telehealth: Payer: Self-pay | Admitting: Family

## 2024-07-30 DIAGNOSIS — F909 Attention-deficit hyperactivity disorder, unspecified type: Secondary | ICD-10-CM

## 2024-07-30 DIAGNOSIS — Z79899 Other long term (current) drug therapy: Secondary | ICD-10-CM | POA: Diagnosis not present

## 2024-07-30 MED ORDER — AMPHETAMINE-DEXTROAMPHETAMINE 20 MG PO TABS: 20.0000 mg | ORAL_TABLET | Freq: Every day | ORAL | 0 refills | Status: AC

## 2024-07-30 NOTE — Progress Notes (Signed)
 Virtual Visit Consent   Katelyn Waller, you are scheduled for a virtual visit with a [facility] provider today. Just as with appointments in the office, your consent must be obtained to participate. Your consent will be active for this visit and any virtual visit you may have with one of our providers in the next 365 days. If you have a MyChart account, a copy of this consent can be sent to you electronically.  As this is a virtual visit, video technology does not allow for your provider to perform a traditional examination. This may limit your provider's ability to fully assess your condition. If your provider identifies any concerns that need to be evaluated in person or the need to arrange testing (such as labs, EKG, etc.), we will make arrangements to do so. Although advances in technology are sophisticated, we cannot ensure that it will always work on either your end or our end. If the connection with a video visit is poor, the visit may have to be switched to a telephone visit. With either a video or telephone visit, we are not always able to ensure that we have a secure connection.  By engaging in this virtual visit, you consent to the provision of healthcare and authorize for your insurance to be billed (if applicable) for the services provided during this visit. Depending on your insurance coverage, you may receive a charge related to this service.  I need to obtain your verbal consent now. Are you willing to proceed with your visit today? Katelyn Waller has provided verbal consent on 07/30/2024 for a virtual visit (video or telephone). [name], FNP  Date: 07/30/2024 11:43 AM   Virtual Visit via Video Note   I, [name], connected with  Katelyn Waller  ([id number], Mar 15, 1986) on 07/30/24 at 11:40 AM EST by a video-enabled telemedicine application and verified that I am speaking with the correct person using two identifiers.  Location: Patient: Virtual Visit Location  Patient: Home Provider: Virtual Visit Location Provider: Home Office   I discussed the limitations of evaluation and management by telemedicine and the availability of in person appointments. The patient expressed understanding and agreed to proceed.    History of Present Illness: Katelyn Waller is a 38 y.o. who identifies as a female who was assigned female at birth, and is being seen today for ADHD. Currently taking Adderall 20 mg daily. This helps her stay on task and focused. Denies any adverse effects.   HPI: HPI  Problems:  Patient Active Problem List   Diagnosis Date Noted   BMI 31.0-31.9,adult 09/24/2021   Controlled substance agreement signed 05/26/2018   ADHD (attention deficit hyperactivity disorder) 02/12/2015    Allergies:  Allergies  Allergen Reactions   Sulfa Antibiotics Rash and Hives   Amoxicillin  Rash   Medications:  Current Outpatient Medications:    amphetamine -dextroamphetamine  (ADDERALL) 20 MG tablet, Take 1 tablet (20 mg total) by mouth daily before breakfast., Disp: 30 tablet, Rfl: 0   [START ON 08/29/2024] amphetamine -dextroamphetamine  (ADDERALL) 20 MG tablet, Take 1 tablet (20 mg total) by mouth daily before breakfast., Disp: 30 tablet, Rfl: 0   [START ON 09/26/2024] amphetamine -dextroamphetamine  (ADDERALL) 20 MG tablet, Take 1 tablet (20 mg total) by mouth daily before breakfast., Disp: 30 tablet, Rfl: 0   SEMAGLUTIDE -WEIGHT MANAGEMENT [place], Inject 1 Dose into the skin once a week., Disp: , Rfl:    silver  sulfADIAZINE  (SILVADENE ) 1 % cream, Apply to affected area daily, Disp: 50 g, Rfl: 1  Observations/Objective: Patient is well-developed, well-nourished in no acute distress.  Resting comfortably  at home.  Head is normocephalic, atraumatic.  No labored breathing. Speech is clear and coherent with logical content.  Patient is alert and oriented at baseline.    Assessment and Plan: 1. Attention deficit hyperactivity disorder (ADHD), unspecified ADHD  type - amphetamine -dextroamphetamine  (ADDERALL) 20 MG tablet; Take 1 tablet (20 mg total) by mouth daily before breakfast.  Dispense: 30 tablet; Refill: 0 - amphetamine -dextroamphetamine  (ADDERALL) 20 MG tablet; Take 1 tablet (20 mg total) by mouth daily before breakfast.  Dispense: 30 tablet; Refill: 0 - amphetamine -dextroamphetamine  (ADDERALL) 20 MG tablet; Take 1 tablet (20 mg total) by mouth daily before breakfast.  Dispense: 30 tablet; Refill: 0  2. Controlled substance agreement signed - amphetamine -dextroamphetamine  (ADDERALL) 20 MG tablet; Take 1 tablet (20 mg total) by mouth daily before breakfast.  Dispense: 30 tablet; Refill: 0 - amphetamine -dextroamphetamine  (ADDERALL) 20 MG tablet; Take 1 tablet (20 mg total) by mouth daily before breakfast.  Dispense: 30 tablet; Refill: 0 - amphetamine -dextroamphetamine  (ADDERALL) 20 MG tablet; Take 1 tablet (20 mg total) by mouth daily before breakfast.  Dispense: 30 tablet; Refill: 0  Continue Adderall 20 mg  Behavior modification as needed Continue all medications  Encourage healthy diet and exercise  Follow-up for recheck in 3 months  Follow Up Instructions: I discussed the assessment and treatment plan with the patient. The patient was provided an opportunity to ask questions and all were answered. The patient agreed with the plan and demonstrated an understanding of the instructions.  A copy of instructions were sent to the patient via MyChart unless otherwise noted below.     The patient was advised to call back or seek an in-person evaluation if the symptoms worsen or if the condition fails to improve as anticipated.    [name], FNP
